# Patient Record
Sex: Male | Born: 1955
Health system: Southern US, Community
[De-identification: ages and names within clinical notes are randomized; demographics above are authoritative.]

## PROBLEM LIST (undated history)

## (undated) DIAGNOSIS — I10 Essential (primary) hypertension: Secondary | ICD-10-CM

## (undated) HISTORY — PX: BACK SURGERY: SHX140

## (undated) HISTORY — PX: HERNIA REPAIR: SHX51

---

## 1999-08-07 ENCOUNTER — Encounter: Payer: Self-pay | Admitting: *Deleted

## 1999-08-08 ENCOUNTER — Encounter: Payer: Self-pay | Admitting: *Deleted

## 1999-08-08 ENCOUNTER — Encounter: Payer: Self-pay | Admitting: Specialist

## 1999-08-08 ENCOUNTER — Inpatient Hospital Stay (HOSPITAL_COMMUNITY): Admission: EM | Admit: 1999-08-08 | Discharge: 1999-08-10 | Payer: Self-pay | Admitting: Emergency Medicine

## 1999-08-09 ENCOUNTER — Encounter: Payer: Self-pay | Admitting: Specialist

## 1999-08-10 ENCOUNTER — Encounter: Payer: Self-pay | Admitting: Specialist

## 1999-09-12 ENCOUNTER — Encounter: Admission: RE | Admit: 1999-09-12 | Discharge: 1999-09-12 | Payer: Self-pay | Admitting: Specialist

## 1999-09-12 ENCOUNTER — Encounter: Payer: Self-pay | Admitting: Specialist

## 1999-10-01 ENCOUNTER — Encounter: Payer: Self-pay | Admitting: Specialist

## 1999-10-01 ENCOUNTER — Ambulatory Visit (HOSPITAL_COMMUNITY): Admission: RE | Admit: 1999-10-01 | Discharge: 1999-10-01 | Payer: Self-pay | Admitting: Specialist

## 2014-08-26 ENCOUNTER — Encounter (HOSPITAL_COMMUNITY): Payer: Self-pay | Admitting: *Deleted

## 2014-08-26 DIAGNOSIS — T83098A Other mechanical complication of other indwelling urethral catheter, initial encounter: Secondary | ICD-10-CM | POA: Insufficient documentation

## 2014-08-26 DIAGNOSIS — Y846 Urinary catheterization as the cause of abnormal reaction of the patient, or of later complication, without mention of misadventure at the time of the procedure: Secondary | ICD-10-CM | POA: Diagnosis not present

## 2014-08-26 DIAGNOSIS — I10 Essential (primary) hypertension: Secondary | ICD-10-CM | POA: Insufficient documentation

## 2014-08-26 DIAGNOSIS — R319 Hematuria, unspecified: Secondary | ICD-10-CM | POA: Diagnosis present

## 2014-08-26 NOTE — ED Notes (Signed)
Pt was seen earlier at [facility]. Pt had decreased urine output so the placed a foley in the pt with immediate return of 12200ml's. Son states the pt had dark urine to begin with and now it is bloody. Pt has had foley since 3pm today. Pt left hospital around 4:30pm.

## 2014-08-27 ENCOUNTER — Emergency Department (HOSPITAL_COMMUNITY)
Admission: EM | Admit: 2014-08-27 | Discharge: 2014-08-27 | Disposition: A | Discharge status: Home / Self Care | Payer: 59 | Attending: Emergency Medicine | Admitting: Emergency Medicine

## 2014-08-27 ENCOUNTER — Emergency Department (HOSPITAL_COMMUNITY): Payer: 59

## 2014-08-27 DIAGNOSIS — T839XXA Unspecified complication of genitourinary prosthetic device, implant and graft, initial encounter: Secondary | ICD-10-CM

## 2014-08-27 DIAGNOSIS — R319 Hematuria, unspecified: Secondary | ICD-10-CM

## 2014-08-27 HISTORY — DX: Essential (primary) hypertension: I10

## 2014-08-27 LAB — URINALYSIS, ROUTINE W REFLEX MICROSCOPIC
Glucose, UA: NEGATIVE mg/dL
Nitrite: POSITIVE — AB
Protein, ur: >300 mg/dL — AB
Specific Gravity, Urine: 1.015 (ref 1.005–1.030)
Urobilinogen, UA: 2 mg/dL — ABNORMAL HIGH (ref 0.0–1.0)
pH: 5 (ref 5.0–8.0)

## 2014-08-27 LAB — URINE MICROSCOPIC-ADD ON

## 2014-08-27 NOTE — Discharge Instructions (Signed)

## 2014-08-27 NOTE — ED Provider Notes (Signed)
CSN: [id number]     Arrival date & time 08/26/14  2224 History  This chart was scribed for Geoffery Lyons, MD by Gwenyth Ober, ED Scribe. This patient was seen in room APA03/APA03 and the patient's care was started at 12:27 AM.    Chief Complaint  Patient presents with  . Hematuria   Patient is a 59 y.o. male presenting with hematuria. The history is provided by the patient. No language interpreter was used.  Hematuria This is a new problem. The current episode started 3 to 5 hours ago. The problem occurs constantly. The problem has been gradually worsening. Pertinent negatives include no chest pain, no abdominal pain, no headaches and no shortness of breath. Nothing aggravates the symptoms. Nothing relieves the symptoms. He has tried nothing for the symptoms. The treatment provided no relief.   HPI Comments: Adam Reeves is a 59 y.o. male, with no chronic medical conditions, who presents to the Emergency Department complaining of gradually worsening bright red hematuria, without clotting, that started earlier today. Pt was seen at [facility] ED earlier this morning for urinary retention and had a foley catheter placed. His family reports that initial urine output in his bag was dark, but is now bloody. Pt had an x-ray and lab work done at [facility]. He was prescribed a muscle relaxer and referred him to urology. Pt denies a history of prostrate issues. He also denies current pain.  Past Medical History  Diagnosis Date  . Hypertension    Past Surgical History  Procedure Laterality Date  . Back surgery    . Hernia repair     History reviewed. No pertinent family history. History  Substance Use Topics  . Smoking status: Never Smoker   . Smokeless tobacco: Not on file  . Alcohol Use: Yes     Comment: occasional    Review of Systems  Respiratory: Negative for shortness of breath.   Cardiovascular: Negative for chest pain.  Gastrointestinal: Negative for abdominal pain.  Genitourinary:  Positive for hematuria and difficulty urinating. Negative for penile pain and testicular pain.  Neurological: Negative for headaches.  All other systems reviewed and are negative.     Allergies  Review of patient's allergies indicates no known allergies.  Home Medications   Prior to Admission medications   Not on File   BP 158/90 mmHg  Pulse 88  Temp(Src) 98.5 F (36.9 C) (Oral)  Resp 18  Ht 6' (1.829 m)  Wt 235 lb (106.595 kg)  BMI 31.86 kg/m2  SpO2 98% Physical Exam  Constitutional: He is oriented to person, place, and time. He appears well-developed and well-nourished.  HENT:  Head: Normocephalic and atraumatic.  Eyes: EOM are normal.  Neck: Normal range of motion.  Cardiovascular: Normal rate, regular rhythm, normal heart sounds and intact distal pulses.   Pulmonary/Chest: Effort normal and breath sounds normal. No respiratory distress.  Abdominal: Soft. He exhibits no distension. There is no tenderness.  Genitourinary:  Foley catheter with leg bag in place. Red colored urine in the bag without clots.  Musculoskeletal: Normal range of motion.  Neurological: He is alert and oriented to person, place, and time.  Skin: Skin is warm and dry.  Psychiatric: He has a normal mood and affect. Judgment normal.  Nursing note and vitals reviewed.   ED Course  Procedures   DIAGNOSTIC STUDIES: Oxygen Saturation is 98% on RA, normal by my interpretation.    COORDINATION OF CARE: 12:38 AM Discussed treatment plan with pt which includes UA  and CT Renal Stone Study. Pt agreed to plan.   Labs Review Labs Reviewed - No data to display  Imaging Review No results found.   EKG Interpretation None      MDM   Final diagnoses:  None    Patient is a 59 year old male presents for evaluation of hematuria. He had a Foley catheter placed earlier today due to urinary retention. Shortly after arriving home urine became red tinged and he presents for evaluation of this. A renal  CT was obtained which reveals bladder wall thickening consistent with bladder outlet obstruction the setting of prostatomegaly. Urinalysis reveals grossly bloody urine.  He is to follow up with his urologist as scheduled.    I personally performed the services described in this documentation, which was scribed in my presence. The recorded information has been reviewed and is accurate.       Geoffery Lyonsouglas Keithon Mccoin, MD 08/27/14 [id number]

## 2015-12-25 ENCOUNTER — Emergency Department (HOSPITAL_COMMUNITY): Payer: Worker's Compensation

## 2015-12-25 ENCOUNTER — Emergency Department (HOSPITAL_COMMUNITY)
Admission: EM | Admit: 2015-12-25 | Discharge: 2015-12-25 | Disposition: A | Discharge status: Home / Self Care | Payer: Worker's Compensation | Attending: Emergency Medicine | Admitting: Emergency Medicine

## 2015-12-25 ENCOUNTER — Encounter (HOSPITAL_COMMUNITY): Payer: Self-pay | Admitting: Emergency Medicine

## 2015-12-25 DIAGNOSIS — N453 Epididymo-orchitis: Secondary | ICD-10-CM | POA: Diagnosis not present

## 2015-12-25 DIAGNOSIS — R112 Nausea with vomiting, unspecified: Secondary | ICD-10-CM | POA: Insufficient documentation

## 2015-12-25 DIAGNOSIS — I10 Essential (primary) hypertension: Secondary | ICD-10-CM | POA: Insufficient documentation

## 2015-12-25 DIAGNOSIS — Z79899 Other long term (current) drug therapy: Secondary | ICD-10-CM | POA: Diagnosis not present

## 2015-12-25 DIAGNOSIS — N400 Enlarged prostate without lower urinary tract symptoms: Secondary | ICD-10-CM | POA: Diagnosis not present

## 2015-12-25 DIAGNOSIS — N50812 Left testicular pain: Secondary | ICD-10-CM

## 2015-12-25 DIAGNOSIS — N50819 Testicular pain, unspecified: Secondary | ICD-10-CM | POA: Diagnosis present

## 2015-12-25 LAB — I-STAT CHEM 8, ED
BUN: 11 mg/dL (ref 6–20)
CALCIUM ION: 1.17 mmol/L (ref 1.12–1.23)
CHLORIDE: 104 mmol/L (ref 101–111)
CREATININE: 0.9 mg/dL (ref 0.61–1.24)
Glucose, Bld: 126 mg/dL — ABNORMAL HIGH (ref 65–99)
HEMATOCRIT: 41 % (ref 39.0–52.0)
Hemoglobin: 13.9 g/dL (ref 13.0–17.0)
POTASSIUM: 3.9 mmol/L (ref 3.5–5.1)
SODIUM: 139 mmol/L (ref 135–145)
TCO2: 22 mmol/L (ref 0–100)

## 2015-12-25 LAB — CBC WITH DIFFERENTIAL/PLATELET
BASOS ABS: 0 10*3/uL (ref 0.0–0.1)
BASOS PCT: 0 %
EOS ABS: 0 10*3/uL (ref 0.0–0.7)
Eosinophils Relative: 0 %
HEMATOCRIT: 39.9 % (ref 39.0–52.0)
HEMOGLOBIN: 13.8 g/dL (ref 13.0–17.0)
Lymphocytes Relative: 4 %
Lymphs Abs: 0.6 10*3/uL — ABNORMAL LOW (ref 0.7–4.0)
MCH: 29.6 pg (ref 26.0–34.0)
MCHC: 34.6 g/dL (ref 30.0–36.0)
MCV: 85.6 fL (ref 78.0–100.0)
MONOS PCT: 10 %
Monocytes Absolute: 1.4 10*3/uL — ABNORMAL HIGH (ref 0.1–1.0)
NEUTROS ABS: 12.5 10*3/uL — AB (ref 1.7–7.7)
NEUTROS PCT: 86 %
Platelets: 182 10*3/uL (ref 150–400)
RBC: 4.66 MIL/uL (ref 4.22–5.81)
RDW: 13 % (ref 11.5–15.5)
WBC: 14.5 10*3/uL — AB (ref 4.0–10.5)

## 2015-12-25 LAB — I-STAT CG4 LACTIC ACID, ED: Lactic Acid, Venous: 0.97 mmol/L (ref 0.5–1.9)

## 2015-12-25 MED ORDER — ONDANSETRON 4 MG PO TBDP: 4.0000 mg | ORAL_TABLET | Freq: Three times a day (TID) | ORAL | Status: DC | PRN

## 2015-12-25 MED ORDER — SODIUM CHLORIDE 0.9 % IV BOLUS (SEPSIS)
1000.0000 mL | Freq: Once | INTRAVENOUS | Status: AC
  Administered 2015-12-25: 1000 mL via INTRAVENOUS

## 2015-12-25 MED ORDER — IBUPROFEN 400 MG PO TABS
600.0000 mg | ORAL_TABLET | Freq: Once | ORAL | Status: AC
  Administered 2015-12-25: 600 mg via ORAL
  Filled 2015-12-25: qty 1

## 2015-12-25 MED ORDER — IBUPROFEN 600 MG PO TABS: 600.0000 mg | ORAL_TABLET | Freq: Four times a day (QID) | ORAL | Status: DC | PRN

## 2015-12-25 MED ORDER — HYDROCODONE-ACETAMINOPHEN 5-325 MG PO TABS
1.0000 | ORAL_TABLET | Freq: Once | ORAL | Status: AC
Start: 2015-12-25 — End: 2015-12-25
  Administered 2015-12-25: 1 via ORAL
  Filled 2015-12-25: qty 1

## 2015-12-25 MED ORDER — LEVOFLOXACIN 500 MG PO TABS: 500.0000 mg | ORAL_TABLET | Freq: Two times a day (BID) | ORAL | Status: DC

## 2015-12-25 MED ORDER — HYDROCODONE-ACETAMINOPHEN 5-325 MG PO TABS: 1.0000 | ORAL_TABLET | Freq: Four times a day (QID) | ORAL | Status: DC | PRN

## 2015-12-25 MED ORDER — IOPAMIDOL (ISOVUE-300) INJECTION 61%
INTRAVENOUS | Status: AC
  Filled 2015-12-25: qty 100

## 2015-12-25 MED ORDER — HYDROMORPHONE HCL 1 MG/ML IJ SOLN
1.0000 mg | Freq: Once | INTRAMUSCULAR | Status: AC
  Administered 2015-12-25: 1 mg via INTRAVENOUS
  Filled 2015-12-25: qty 1

## 2015-12-25 MED ORDER — ONDANSETRON HCL 4 MG/2ML IJ SOLN
4.0000 mg | Freq: Once | INTRAMUSCULAR | Status: AC
  Administered 2015-12-25: 4 mg via INTRAVENOUS
  Filled 2015-12-25: qty 2

## 2015-12-25 NOTE — ED Notes (Signed)
Onset 3 days ago lifting heavy furniture pain testicles increasing in size today. Seen at [facility]Doctor's office today sent to ED for evaluation via EMS.  Nausea and emesis en route with testicle pain. EMS administered Zofran 4mg  and Morphine 10mg  with 0.9 NS 100ML.

## 2015-12-25 NOTE — Discharge Instructions (Signed)
See the Urologist in 1 week. Take the meds as requested. Scrotal elevation, rest from athletic activity, warm baths, and NSAIDs are main treatment options.   Orchitis Orchitis is swelling (inflammation) of a testicle caused by infection. Testicles are the male organs that produce sperm. The testicles are held in a fleshy sac (scrotum) located behind the penis. Orchitis usually affects only one testicle, but it can occur in both. The condition can develop suddenly. Orchitis can be caused by many different kinds of bacteria and viruses. CAUSES Orchitis can be caused by either a bacterial or viral infection. Bacterial Infections  These often occur along with an infection of the coiled tube that collects sperm and sits on top of the testicle (epididymis).  In men who are not sexually active, bacterial orchitis usually starts as a urinary tract infection and spreads to the testicle.  In sexually active men, sexually transmitted infections are the most common cause of bacterial orchitis. These can include:  Gonorrhea.  Chlamydia. Viral Infections  Mumps is still the most common cause of viral orchitis, though mumps is now rare in many areas because of vaccination.  Other viruses that can cause orchitis include:  The chickenpox virus (varicella-zoster virus).  The virus that causes mononucleosis (Epstein-Barr virus). RISK FACTORS Boys and men who have not been vaccinated against mumps are at risk for mumps orchitis. Risk factors for bacterial orchitis include:  Frequent urinary tract infections.  High-risk sexual behaviors.  Having a sexual partner with a sexually transmitted infection.  Having had urinary tract surgery.  Using a tube passed through the penis to drain urine (Foley catheter).  An enlarged prostate gland. SIGNS AND SYMPTOMS The most common symptoms of orchitis are swelling and pain in the scrotum. Other signs and symptoms may include:  Feeling generally sick  (malaise).  Fever and chills.  Painful urination.  Painful ejaculation.  Blood or discharge from the penis.  Nausea.  Headache.  Fatigue. DIAGNOSIS Your health care provider may suspect orchitis if you have a painful, swollen testicle along with other signs and symptoms of the condition. A physical exam will be done. Tests may also be done to help your health care provider make a diagnosis. These may include:  A blood test to check for signs of infection.  A urine test to check for a urinary tract infection.  Using a swab to collect a fluid sample from the tip of the penis to test for sexually transmitted infections.  Taking an image of the testicle using sound waves and a computer (testicular ultrasound). TREATMENT Treatment of orchitis depends on the cause. For orchitis caused by a bacterial infection, your health care provider will most likely prescribe antibiotic medicines. Bacterial infections usually clear up within a few days. Both viral infections and bacterial infections may be treated with:  Bed rest.  Anti-inflammatory medicines.  Pain medicines.  Elevating the scrotum and applying ice. HOME CARE INSTRUCTIONS  Rest as directed by your health care provider.  Take medicines only as directed by your health care provider.  If you were prescribed an antibiotic medicine, finish it all even if you start to feel better.  Elevate your scrotum and apply ice as directed:  Put ice in a plastic bag.  Place a small towel or pillow between your legs.  Rest your scrotum on the pillow or towel.  Place another towel between your skin and the plastic bag.  Leave the ice on for 20 minutes, 2-3 times a day. SEEK MEDICAL CARE  IF:  You have a fever.  Pain and swelling have not gotten better after 3 days. SEEK IMMEDIATE MEDICAL CARE IF:  Your pain is getting worse.  The swelling in your testicle gets worse.   This information is not intended to replace advice  given to you by your health care provider. Make sure you discuss any questions you have with your health care provider.   Document Released: 05/24/2000 Document Revised: 06/17/2014 Document Reviewed: 10/14/2013 Elsevier Interactive Patient Education 2016 [employer] Epididymitis Epididymitis is swelling (inflammation) of the epididymis. The epididymis is a cord-like structure that is located along the top and back part of the testicle. It collects and stores sperm from the testicle. This condition can also cause pain and swelling of the testicle and scrotum. Symptoms usually start suddenly (acute epididymitis). Sometimes epididymitis starts gradually and lasts for a while (chronic epididymitis). This type may be harder to treat. CAUSES In men 1535 and younger, this condition is usually caused by a bacterial infection or sexually transmitted disease (STD), such as:  Gonorrhea.  Chlamydia.  In men 2435 and older who do not have anal sex, this condition is usually caused by bacteria from a blockage or abnormalities in the urinary system. These can result from:  Having a tube placed into the bladder (urinary catheter).  Having an enlarged or inflamed prostate gland.  Having recent urinary tract surgery. In men who have a condition that weakens the body's defense system (immune system), such as HIV, this condition can be caused by:   Other bacteria, including tuberculosis and syphilis.  Viruses.  Fungi. Sometimes this condition occurs without infection. That may happen if urine flows backward into the epididymis after heavy lifting or straining. RISK FACTORS This condition is more likely to develop in men:  Who have unprotected sex with more than one partner.  Who have anal sex.   Who have recently had surgery.   Who have a urinary catheter.  Who have urinary problems.  Who have a suppressed immune system. SYMPTOMS  This condition usually begins suddenly with chills, fever,  and pain behind the scrotum and in the testicle. Other symptoms include:   Swelling of the scrotum, testicle, or both.  Pain whenejaculatingor urinating.  Pain in the back or belly.  Nausea.  Itching and discharge from the penis.  Frequent need to pass urine.  Redness and tenderness of the scrotum. DIAGNOSIS Your health care provider can diagnose this condition based on your symptoms and medical history. Your health care provider will also do a physical exam to ask about your symptoms and check your scrotum and testicle for swelling, pain, and redness. You may also have other tests, including:   Examination of discharge from the penis.  Urine tests for infections, such as STDs.  Your health care provider may test you for other STDs, including HIV. TREATMENT Treatment for this condition depends on the cause. If your condition is caused by a bacterial infection, oral antibiotic medicine may be prescribed. If the bacterial infection has spread to your blood, you may need to receive IV antibiotics. Nonbacterial epididymitis is treated with home care that includes bed rest and elevation of the scrotum. Surgery may be needed to treat:  Bacterial epididymitis that causes pus to build up in the scrotum (abscess).  Chronic epididymitis that has not responded to other treatments. HOME CARE INSTRUCTIONS Medicines  Take over-the-counter and prescription medicines only as told by your health care provider.   If you were prescribed an  antibiotic medicine, take it as told by your health care provider. Do not stop taking the antibiotic even if your condition improves. Sexual Activity  If your epididymitis was caused by an STD, avoid sexual activity until your treatment is complete.  Inform your sexual partner or partners if you test positive for an STD. They may need to be treated.Do not engage in sexual activity with your partner or partners until their treatment is  completed. General Instructions  Return to your normal activities as told by your health care provider. Ask your health care provider what activities are safe for you.  Keep your scrotum elevated and supported while resting. Ask your health care provider if you should wear a scrotal support, such as a jockstrap. Wear it as told by your health care provider.  If directed, apply ice to the affected area:   Put ice in a plastic bag.  Place a towel between your skin and the bag.  Leave the ice on for 20 minutes, 2-3 times per day.  Try taking a sitz bath to help with discomfort. This is a warm water bath that is taken while you are sitting down. The water should only come up to your hips and should cover your buttocks. Do this 3-4 times per day or as told by your health care provider.  Keep all follow-up visits as told by your health care provider. This is important. SEEK MEDICAL CARE IF:   You have a fever.   Your pain medicine is not helping.   Your pain is getting worse.   Your symptoms do not improve within three days.   This information is not intended to replace advice given to you by your health care provider. Make sure you discuss any questions you have with your health care provider.   Document Released: 05/24/2000 Document Revised: 02/15/2015 Document Reviewed: 10/12/2014 Elsevier Interactive Patient Education [employer]

## 2015-12-25 NOTE — ED Notes (Signed)
Patient transported to Ultrasound 

## 2015-12-25 NOTE — ED Provider Notes (Addendum)
CSN: [id number]     Arrival date & time 12/25/15  1357 History   First MD Initiated Contact with Patient 12/25/15 1412     Chief Complaint  Patient presents with  . Testicle Pain  . Nausea  . Emesis     (Consider location/radiation/quality/duration/timing/severity/associated sxs/prior Treatment) HPI Comments: PT comes in with scrotal pain. Pt reports that his pain started on Friday, after he moved some furniture. Pain has intensified since then and so has the swelling. Pain is throbbing and severe, with associated nausea and emesis. Pt has no trauma that he can think of and he didn't feel a pop or something dropping into his scrotum when he was working. No penile discharge or risk for STD.   ROS 10 Systems reviewed and are negative for acute change except as noted in the HPI.     Patient is a 60 y.o. male presenting with testicular pain and vomiting. The history is provided by the patient.  Testicle Pain  Emesis   Past Medical History  Diagnosis Date  . Hypertension    Past Surgical History  Procedure Laterality Date  . Back surgery    . Hernia repair     No family history on file. Social History  Substance Use Topics  . Smoking status: Never Smoker   . Smokeless tobacco: None  . Alcohol Use: Yes     Comment: occasional    Review of Systems  Gastrointestinal: Positive for vomiting.  Genitourinary: Positive for testicular pain.      Allergies  Review of patient's allergies indicates no known allergies.  Home Medications   Prior to Admission medications   Medication Sig Start Date End Date Taking? Authorizing Provider  HYDROcodone-acetaminophen (NORCO/VICODIN) 5-325 MG tablet Take 1 tablet by mouth every 6 (six) hours as needed for severe pain. 12/25/15   Derwood KaplanAnkit Damyon Mullane, MD  ibuprofen (ADVIL,MOTRIN) 600 MG tablet Take 1 tablet (600 mg total) by mouth every 6 (six) hours as needed. 12/25/15   Derwood KaplanAnkit Octavion Mollenkopf, MD  levofloxacin (LEVAQUIN) 500 MG tablet Take 1  tablet (500 mg total) by mouth 2 (two) times daily. 12/25/15   Derwood KaplanAnkit Dylen Mcelhannon, MD  ondansetron (ZOFRAN ODT) 4 MG disintegrating tablet Take 1 tablet (4 mg total) by mouth every 8 (eight) hours as needed for nausea or vomiting. 12/25/15   Derwood KaplanAnkit Braxden Lovering, MD   BP 141/77 mmHg  Pulse 97  Temp(Src) 99.6 F (37.6 C) (Oral)  Resp 17  Ht 6\' 8"  (2.032 m)  Wt 240 lb (108.863 kg)  BMI 26.37 kg/m2  SpO2 95% Physical Exam  Constitutional: He is oriented to person, place, and time. He appears well-developed.  HENT:  Head: Normocephalic and atraumatic.  Eyes: Conjunctivae and EOM are normal. Pupils are equal, round, and reactive to light.  Neck: Normal range of motion. Neck supple.  Cardiovascular: Normal rate, regular rhythm and normal heart sounds.   Pulmonary/Chest: Effort normal and breath sounds normal. No respiratory distress. He has no wheezes.  Abdominal: Soft. Bowel sounds are normal. He exhibits no distension. There is no tenderness. There is no rebound and no guarding.  Genitourinary:  Bilateral scrotal enlargement, L worse than R. Pt has induration in the L scrotum, no light illumination. Pt has no cremasteric reflex. Pt has severe tenderness of the L hemiscrotum and the scrotum is indurated.  Neurological: He is alert and oriented to person, place, and time.  Skin: Skin is warm.  Nursing note and vitals reviewed.   ED Course  Procedures (including critical  care time) Labs Review Labs Reviewed  CBC WITH DIFFERENTIAL/PLATELET - Abnormal; Notable for the following:    WBC 14.5 (*)    Neutro Abs 12.5 (*)    Lymphs Abs 0.6 (*)    Monocytes Absolute 1.4 (*)    All other components within normal limits  I-STAT CHEM 8, ED - Abnormal; Notable for the following:    Glucose, Bld 126 (*)    All other components within normal limits  I-STAT CG4 LACTIC ACID, ED    Imaging Review US Scrotum  12/25/2015  CLINICAL DATA:  60 year old male with left-sided testicular redness and swelling since  12/22/2015 after lifting heavy furniture. EXAM: SCROTAL ULTRASOUND DOPPLER ULTRASOUND OF THE TESTICLES TECHNIQUE: Complete ultrasound examination of the testicles, epididymis, and other scrotal structures was performed. Color and spectral Doppler ultrasound were also utilized to evaluate blood flow to the testicles. COMPARISON:  No priors. FINDINGS: Right testicle Measurements: 5.1 x 2.5 x 3.5 cm. No mass or microlithiasis visualized. Left testicle Measurements: 5.1 x 4.1 x 4.1 cm. No mass or microlithiasis visualized. Right epididymis:  Normal in size and appearance. Left epididymis: Mildly enlarged measuring 2.8 x 1.6 x 2.5 cm, and diffusely increased vascularity. Hydrocele: Small right hydrocele. Moderate left-sided hydrocele which is highly septated. Varicocele:  Bilateral (veins measure up to 3 mm in diameter). Pulsed Doppler interrogation of both testes demonstrates normal low resistance arterial and venous waveforms in the right testicle and right epididymis. Increased vascularity noted in the left testicle and left epididymis. Other:  Extensive scrotal skin thickening. IMPRESSION: 1. Findings are compatible with a left-sided epididymo-orchitis with moderate complex left-sided hydrocele. 2. Small right-sided hydrocele. 3. Bilateral varicocele. Electronically Signed   By: Trudie Reed M.D.   On: 12/25/2015 15:53   Ct Abdomen Pelvis W Contrast  12/25/2015  CLINICAL DATA:  Possible incarcerated hernia, left orchitis, left side pain after lifting heavy furniture 3 days ago EXAM: CT ABDOMEN AND PELVIS WITH CONTRAST TECHNIQUE: Multidetector CT imaging of the abdomen and pelvis was performed using the standard protocol following bolus administration of intravenous contrast. CONTRAST:  100 cc Isovue COMPARISON:  Ultrasound 12/25/2015 and CT scan 08/27/2014 FINDINGS: Lower chest:  The lung bases are unremarkable.  Small hiatal hernia. Hepatobiliary: Enhanced liver is unremarkable. No calcified gallstones are  noted within gallbladder. Pancreas: Enhanced pancreas is unremarkable. Spleen: No focal splenic mass. Small capsular calcification mid aspect of the spleen is stable. Adrenals/Urinary Tract: No adrenal gland mass. No nephrolithiasis. No hydronephrosis or hydroureter. Mild dilatation of left extrarenal pelvis. Delayed renal images shows bilateral renal symmetrical excretion. Bilateral visualized proximal ureter is unremarkable. There is mild thickening of anterior wall of the urinary bladder. Mild cystitis cannot be excluded. Stomach/Bowel: No gastric outlet obstruction. Mild gaseous distended small bowel loops in mid abdomen and lower abdomen. Mild ileus or enteritis cannot be excluded. No definite evidence of small bowel obstruction. Moderate gas and some stool noted within cecum. Normal appendix. No pericecal inflammation. Colonic diverticula are noted descending colon and proximal sigmoid colon. No evidence of acute diverticulitis. Vascular/Lymphatic: No aortic aneurysm. No retroperitoneal or mesenteric adenopathy. Reproductive: There is significant enlarged prostate gland with indentation of urinary bladder base. Prostate gland measures 7.2 by 6.1 cm. There is also some heterogeneous enhancement of left seminal vesicle. Inflammation cannot be excluded. Clinical correlation is necessary Other: Bilateral small varicocele is noted the left greater than right. There is enhancement of the left testicle. Findings highly suspicious for left orchitis as seen on recent scrotal ultrasound. No  inguinal hernia. Musculoskeletal: No destructive bony lesions are noted. Sagittal images of the spine shows degenerative changes lumbar spine. There is Schmorl's node deformity upper endplate of L3. Disc space flattening with mild anterior spurring at L4-L5 and L5-S1 level. Facet degenerative changes L4 and L5 level. IMPRESSION: 1. Mild gaseous distended small bowel loops in mid abdomen and lower abdomen with some air-fluid level  suspicious for ileus or enteritis. No definite evidence of small bowel obstruction. 2. There is enlarged prostate gland with indentation of urinary bladder base. Correlation with urology exam is recommended. Mild thickening of anterior wall of the urinary bladder. Mild cystitis cannot be excluded. There is heterogeneous enhancement of left seminal vesicle. Inflammatory changes cannot be excluded. Clinical correlation is necessary. 3. No inguinal hernia. 4. Bilateral varicocele is noted left greater than right. There is diffuse enhancement of the left testicle highly suspicious for inflammation or infection. 5. Degenerative changes lumbar spine. 6. No hydronephrosis or hydroureter. 7. No pericecal inflammation.  Normal appendix. Electronically Signed   By: Natasha Mead M.D.   On: 12/25/2015 16:12   [place] Art/ven Flow Abd Pelv Doppler  12/25/2015  CLINICAL DATA:  60 year old male with left-sided testicular redness and swelling since 12/22/2015 after lifting heavy furniture. EXAM: SCROTAL ULTRASOUND DOPPLER ULTRASOUND OF THE TESTICLES TECHNIQUE: Complete ultrasound examination of the testicles, epididymis, and other scrotal structures was performed. Color and spectral Doppler ultrasound were also utilized to evaluate blood flow to the testicles. COMPARISON:  No priors. FINDINGS: Right testicle Measurements: 5.1 x 2.5 x 3.5 cm. No mass or microlithiasis visualized. Left testicle Measurements: 5.1 x 4.1 x 4.1 cm. No mass or microlithiasis visualized. Right epididymis:  Normal in size and appearance. Left epididymis: Mildly enlarged measuring 2.8 x 1.6 x 2.5 cm, and diffusely increased vascularity. Hydrocele: Small right hydrocele. Moderate left-sided hydrocele which is highly septated. Varicocele:  Bilateral (veins measure up to 3 mm in diameter). Pulsed Doppler interrogation of both testes demonstrates normal low resistance arterial and venous waveforms in the right testicle and right epididymis. Increased vascularity  noted in the left testicle and left epididymis. Other:  Extensive scrotal skin thickening. IMPRESSION: 1. Findings are compatible with a left-sided epididymo-orchitis with moderate complex left-sided hydrocele. 2. Small right-sided hydrocele. 3. Bilateral varicocele. Electronically Signed   By: Trudie Reed M.D.   On: 12/25/2015 15:53   I have personally reviewed and evaluated these images and lab results as part of my medical decision-making.   EKG Interpretation None      MDM   Final diagnoses:  Pain in left testicle  Epididymo-orchitis without abscess    Pt has 3 days of worsening pain in his L hemiscrotum. Clinical concerns for incarcerated inguinal hernia vs. Inguinal hernia vs. Torsed testicle vs. Testicular trauma/traumatic epididymitis. US scrotum STAT ordered. CT abd pelvis ordered.  Pain meds started.  Derwood Kaplan, MD 12/25/15 1506  Derwood Kaplan, MD 12/25/15 [phone]

## 2015-12-26 ENCOUNTER — Telehealth: Payer: Self-pay | Admitting: *Deleted

## 2015-12-26 MED ORDER — IOPAMIDOL (ISOVUE-300) INJECTION 61%
100.0000 mL | Freq: Once | INTRAVENOUS | Status: AC | PRN
  Administered 2015-12-25: 100 mL via INTRAVENOUS

## 2017-09-01 ENCOUNTER — Encounter: Payer: Self-pay | Admitting: Pediatrics

## 2017-09-01 ENCOUNTER — Ambulatory Visit: Payer: PRIVATE HEALTH INSURANCE | Admitting: Pediatrics

## 2017-09-01 VITALS — BP 156/91 | HR 72 | Temp 96.9°F | Ht >= 80 in | Wt 241.0 lb

## 2017-09-01 DIAGNOSIS — I10 Essential (primary) hypertension: Secondary | ICD-10-CM

## 2017-09-01 DIAGNOSIS — Z0000 Encounter for general adult medical examination without abnormal findings: Secondary | ICD-10-CM

## 2017-09-01 DIAGNOSIS — Z1211 Encounter for screening for malignant neoplasm of colon: Secondary | ICD-10-CM

## 2017-09-01 MED ORDER — LISINOPRIL 20 MG PO TABS: 20.0000 mg | ORAL_TABLET | Freq: Every day | ORAL | 3 refills | Status: DC

## 2017-09-01 NOTE — Progress Notes (Signed)
Subjective:   Patient ID: Adam Reeves, male    DOB: [birth year], 62 y.o.   MRN: [id number] CC: New Patient (Initial Visit) Elevated blood pressure   HPI: Adam Reeves is a 63 y.o. male presenting for New Patient (Initial Visit)  Blood pressure recently checked for work.  160s over 90s.  He says it is similar at home.  The lowest systolic blood pressure he can remember is in the 140s.  He says a couple years ago he was on lisinopril 20 mg.  This was stopped because he was told he no longer needed it.  He knows when his blood pressure gets high because he hears more ringing in his ears.  Works as a Programmer, systems.  Is out of town for about a week at a time.  No family history of colon cancer, prostate cancer.  Grew up in foster care, knew his mom and dad.  Blood transfusion when he is baby for "mismatched blood".  Regular exercises, walks regularly and enjoys riding his bicycle.  In the morning sometimes has some dribbling with his urine stream.  He gets better throughout the day.  He has been on both finasteride and Flomax together in the past.  Episode of urinary retention when he had a testicular injury about 2 years ago.  He is not needed to self cath in over a year.  He does not like the way that the Flomax and finasteride made him feel so he stopped them over a year ago..  No worsening in symptoms and then.  No new sexual partners since his divorce 12 years ago.  He declines STI testing today.  Past Medical History:  Diagnosis Date  . Hypertension    History reviewed. No pertinent family history. Social History   Socioeconomic History  . Marital status: Divorced    Spouse name: Not on file  . Number of children: 4  . Years of education: Not on file  . Highest education level: Not on file  Occupational History  . Not on file  Social Needs  . Financial resource strain: Not hard at all  . Food insecurity:    Worry: Never true    Inability: Never true  .  Transportation needs:    Medical: No    Non-medical: No  Tobacco Use  . Smoking status: Never Smoker  . Smokeless tobacco: Never Used  Substance and Sexual Activity  . Alcohol use: Yes    Comment: occasional  . Drug use: No  . Sexual activity: Yes  Lifestyle  . Physical activity:    Days per week: 0 days    Minutes per session: 0 min  . Stress: Not at all  Relationships  . Social connections:    Talks on phone: Once a week    Gets together: Once a week    Attends religious service: 1 to 4 times per year    Active member of club or organization: No    Attends meetings of clubs or organizations: Never    Relationship status: Divorced  Other Topics Concern  . Not on file  Social History Narrative  . Not on file   ROS: All systems negative other than what is in HPI  Objective:    BP (!) 156/91   Pulse 72   Temp (!) 96.9 F (36.1 C) (Oral)   Ht '6\' 8"'$  (2.032 m)   Wt 241 lb (109.3 kg)   BMI 26.48 kg/m   Wt Readings from  Last 3 Encounters:  09/01/17 241 lb (109.3 kg)  12/25/15 240 lb (108.9 kg)  08/26/14 235 lb (106.6 kg)    Gen: NAD, alert, cooperative with exam, NCAT EYES: EOMI, no conjunctival injection, or no icterus ENT: Hearing aids in place.  TMs dull gray b/l, OP without erythema LYMPH: no cervical LAD CV: NRRR, normal S1/S2, no murmur, distal pulses 2+ b/l Resp: CTABL, no wheezes, normal WOB Abd: +BS, soft, NTND. no guarding or organomegaly Ext: No edema, warm Neuro: Alert and oriented, strength equal b/l UE and LE, coordination grossly normal MSK: normal muscle bulk  Assessment & Plan:  Hoyle was seen today for new patient (initial visit) and annual.  Diagnoses and all orders for this visit:  Encounter for preventive health examination -     Lipid panel -     CMP14+EGFR -     Hepatitis C antibody -     PSA, total and free  Essential hypertension Elevated blood pressure today.  Start below.  Will check labs.  Check blood pressures daily at home.   Bring numbers to next office visit. -     lisinopril (PRINIVIL,ZESTRIL) 20 MG tablet; Take 1 tablet (20 mg total) by mouth daily.  Colon cancer screening Declines colonoscopy.  Open to doing below. -     Fecal occult blood, imunochemical; Future   Follow up plan: Return in about 2 weeks (around 09/15/2017).  Assunta Found, MD [place]

## 2017-09-01 NOTE — Patient Instructions (Signed)
Check blood pressures at home.  Bring list of numbers back at next visit in 2 weeks.

## 2017-09-02 LAB — HEPATITIS C ANTIBODY: Hep C Virus Ab: <0.1 s/co ratio (ref 0.0–0.9)

## 2017-09-02 LAB — LIPID PANEL
CHOL/HDL RATIO: 4.8 ratio (ref 0.0–5.0)
Cholesterol, Total: 176 mg/dL (ref 100–199)
HDL: 37 mg/dL — ABNORMAL LOW (ref >39)
LDL CALC: 116 mg/dL — AB (ref 0–99)
TRIGLYCERIDES: 114 mg/dL (ref 0–149)
VLDL Cholesterol Cal: 23 mg/dL (ref 5–40)

## 2017-09-02 LAB — CMP14+EGFR
A/G RATIO: 1.8 (ref 1.2–2.2)
ALT: 28 IU/L (ref 0–44)
AST: 23 IU/L (ref 0–40)
Albumin: 4.6 g/dL (ref 3.6–4.8)
Alkaline Phosphatase: 87 IU/L (ref 39–117)
BUN/Creatinine Ratio: 17 (ref 10–24)
BUN: 16 mg/dL (ref 8–27)
Bilirubin Total: 0.3 mg/dL (ref 0.0–1.2)
CALCIUM: 9.6 mg/dL (ref 8.6–10.2)
CHLORIDE: 106 mmol/L (ref 96–106)
CO2: 17 mmol/L — ABNORMAL LOW (ref 20–29)
Creatinine, Ser: 0.92 mg/dL (ref 0.76–1.27)
GFR calc Af Amer: 103 mL/min/{1.73_m2} (ref >59)
GFR, EST NON AFRICAN AMERICAN: 89 mL/min/{1.73_m2} (ref >59)
Globulin, Total: 2.6 g/dL (ref 1.5–4.5)
Glucose: 100 mg/dL — ABNORMAL HIGH (ref 65–99)
POTASSIUM: 4.3 mmol/L (ref 3.5–5.2)
Sodium: 142 mmol/L (ref 134–144)
Total Protein: 7.2 g/dL (ref 6.0–8.5)

## 2017-09-02 LAB — PSA, TOTAL AND FREE
PSA FREE: 1.1 ng/mL
PSA, Free Pct: 12.4 %
Prostate Specific Ag, Serum: 8.9 ng/mL — ABNORMAL HIGH (ref 0.0–4.0)

## 2017-09-04 ENCOUNTER — Telehealth: Payer: Self-pay | Admitting: *Deleted

## 2017-09-04 ENCOUNTER — Other Ambulatory Visit: Payer: Self-pay | Admitting: Pediatrics

## 2017-09-04 DIAGNOSIS — R972 Elevated prostate specific antigen [PSA]: Secondary | ICD-10-CM

## 2017-09-04 NOTE — Telephone Encounter (Signed)
Patient called back with an explanation of labs.  Explained results to patient and patient states that he does not want to see a urologist at this time.

## 2017-09-05 NOTE — Telephone Encounter (Signed)
Tried to call pt back, not able to to reach him. Is there a good time to reach him if he calls back?

## 2017-09-15 ENCOUNTER — Encounter: Payer: Self-pay | Admitting: Pediatrics

## 2017-09-15 ENCOUNTER — Ambulatory Visit: Payer: PRIVATE HEALTH INSURANCE | Admitting: Pediatrics

## 2017-09-15 VITALS — BP 137/84 | HR 79 | Temp 97.1°F | Ht >= 80 in | Wt 238.2 lb

## 2017-09-15 DIAGNOSIS — G4700 Insomnia, unspecified: Secondary | ICD-10-CM | POA: Diagnosis not present

## 2017-09-15 DIAGNOSIS — I10 Essential (primary) hypertension: Secondary | ICD-10-CM | POA: Diagnosis not present

## 2017-09-15 DIAGNOSIS — R972 Elevated prostate specific antigen [PSA]: Secondary | ICD-10-CM

## 2017-09-15 DIAGNOSIS — Z1211 Encounter for screening for malignant neoplasm of colon: Secondary | ICD-10-CM

## 2017-09-15 MED ORDER — TRAZODONE HCL 50 MG PO TABS: 25.0000 mg | ORAL_TABLET | Freq: Every evening | ORAL | 3 refills | Status: DC | PRN

## 2017-09-15 MED ORDER — LISINOPRIL 40 MG PO TABS: 40.0000 mg | ORAL_TABLET | Freq: Every day | ORAL | 1 refills | Status: DC

## 2017-09-15 NOTE — Addendum Note (Signed)
Addended by: Orma RenderHODGES, Titiana Severa F on: 09/15/2017 03:36 PM   Modules accepted: Orders

## 2017-09-15 NOTE — Progress Notes (Signed)
  Subjective:   Patient ID: Adam Reeves, male    DOB: 05/06/1956, 62 y.o.   MRN: [id number] CC: Follow-up (2 week, BP)  HPI: Adam Reeves is a 62 y.o. male presenting for Follow-up (2 week, BP)  Hypertension: Blood pressures at home systolics 441N-127K, diastolics upper 71U-36D.  No headaches or lightheadedness.  Elevated PSA: Follows with urology.  Says he will not be able to get back into see them until later this year after he finishes paying her bills.  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  Wants to be able to sleep overnight.  Has tried melatonin and Benadryl.  He feels groggy the next day and also is still usually up within 2 hours of taking it.  Relevant past medical, surgical, family and social history reviewed. Allergies and medications reviewed and updated. Social History   Tobacco Use  Smoking Status Never Smoker  Smokeless Tobacco Never Used   ROS: Per HPI   Objective:    BP 137/84   Pulse 79   Temp (!) 97.1 F (36.2 C) (Oral)   Ht '6\' 8"'$  (2.032 m)   Wt 238 lb 3.2 oz (108 kg)   BMI 26.17 kg/m   Wt Readings from Last 3 Encounters:  09/15/17 238 lb 3.2 oz (108 kg)  09/01/17 241 lb (109.3 kg)  12/25/15 240 lb (108.9 kg)    Gen: NAD, alert, cooperative with exam, NCAT EYES: EOMI, no conjunctival injection, or no icterus CV: NRRR, normal S1/S2, no murmur, distal pulses 2+ b/l Resp: CTABL, no wheezes, normal WOB Ext: No edema, warm Neuro: Alert and oriented, strength equal b/l UE and LE, coordination grossly normal MSK: normal muscle bulk  Assessment & Plan:  Maris was seen today for follow-up multiple medical problems.  Diagnoses and all orders for this visit:  Insomnia, unspecified type Will do trial below.  Discussed sleep hygiene. -     traZODone (DESYREL) 50 MG tablet; Take 0.5-1 tablets (25-50 mg total) by mouth at bedtime as needed for sleep.  Essential hypertension Remains elevated.  Will  increase to 40 mg. -     BMP8+EGFR -     lisinopril (PRINIVIL,ZESTRIL) 40 MG tablet; Take 1 tablet (40 mg total) by mouth daily.  Elevated PSA Has a urologist.  Patient hoping to be able to get into see him after being his bill with him later this year.  Follow up plan: Return in about 6 months (around 03/17/2018). Assunta Found, MD [place]

## 2017-09-15 NOTE — Patient Instructions (Addendum)
Come back for nurse visit in 4 weeks, bring your blood pressure cuff.  Let me know if blood pressure is regularly greater than 140 on the top number, greater than 90 on the bottom number.  Take 40 mg of lisinopril daily.

## 2017-09-16 LAB — BMP8+EGFR
BUN / CREAT RATIO: 15 (ref 10–24)
BUN: 14 mg/dL (ref 8–27)
CO2: 22 mmol/L (ref 20–29)
CREATININE: 0.91 mg/dL (ref 0.76–1.27)
Calcium: 9.5 mg/dL (ref 8.6–10.2)
Chloride: 103 mmol/L (ref 96–106)
GFR calc Af Amer: 104 mL/min/{1.73_m2} (ref >59)
GFR calc non Af Amer: 90 mL/min/{1.73_m2} (ref >59)
GLUCOSE: 81 mg/dL (ref 65–99)
Potassium: 4.3 mmol/L (ref 3.5–5.2)
Sodium: 140 mmol/L (ref 134–144)

## 2017-09-17 LAB — FECAL OCCULT BLOOD, IMMUNOCHEMICAL: Fecal Occult Bld: NEGATIVE

## 2018-06-10 ENCOUNTER — Other Ambulatory Visit: Payer: Self-pay | Admitting: Pediatrics

## 2018-06-10 DIAGNOSIS — I10 Essential (primary) hypertension: Secondary | ICD-10-CM

## 2018-07-17 DIAGNOSIS — R972 Elevated prostate specific antigen [PSA]: Secondary | ICD-10-CM | POA: Diagnosis not present

## 2018-07-17 DIAGNOSIS — R339 Retention of urine, unspecified: Secondary | ICD-10-CM | POA: Diagnosis not present

## 2018-07-17 DIAGNOSIS — N401 Enlarged prostate with lower urinary tract symptoms: Secondary | ICD-10-CM | POA: Diagnosis not present

## 2018-08-28 ENCOUNTER — Other Ambulatory Visit: Payer: Self-pay

## 2018-08-28 ENCOUNTER — Encounter: Payer: Self-pay | Admitting: Family Medicine

## 2018-08-28 ENCOUNTER — Ambulatory Visit: Payer: BLUE CROSS/BLUE SHIELD | Admitting: Family Medicine

## 2018-08-28 VITALS — BP 148/86 | HR 74 | Temp 97.3°F | Ht >= 80 in | Wt 243.0 lb

## 2018-08-28 DIAGNOSIS — I10 Essential (primary) hypertension: Secondary | ICD-10-CM | POA: Insufficient documentation

## 2018-08-28 DIAGNOSIS — E782 Mixed hyperlipidemia: Secondary | ICD-10-CM

## 2018-08-28 DIAGNOSIS — N401 Enlarged prostate with lower urinary tract symptoms: Secondary | ICD-10-CM | POA: Insufficient documentation

## 2018-08-28 DIAGNOSIS — N400 Enlarged prostate without lower urinary tract symptoms: Secondary | ICD-10-CM | POA: Diagnosis not present

## 2018-08-28 DIAGNOSIS — Z6826 Body mass index (BMI) 26.0-26.9, adult: Secondary | ICD-10-CM | POA: Insufficient documentation

## 2018-08-28 DIAGNOSIS — R972 Elevated prostate specific antigen [PSA]: Secondary | ICD-10-CM

## 2018-08-28 DIAGNOSIS — N138 Other obstructive and reflux uropathy: Secondary | ICD-10-CM | POA: Insufficient documentation

## 2018-08-28 MED ORDER — LISINOPRIL 40 MG PO TABS: 40.0000 mg | ORAL_TABLET | Freq: Every day | ORAL | 2 refills | Status: DC

## 2018-08-28 MED ORDER — HYDROCHLOROTHIAZIDE 12.5 MG PO CAPS: 12.5000 mg | ORAL_CAPSULE | Freq: Every day | ORAL | 3 refills | Status: DC

## 2018-08-28 NOTE — Patient Instructions (Signed)
DASH Eating Plan  DASH stands for "Dietary Approaches to Stop Hypertension." The DASH eating plan is a healthy eating plan that has been shown to reduce high blood pressure (hypertension). It may also reduce your risk for type 2 diabetes, heart disease, and stroke. The DASH eating plan may also help with weight loss.  What are tips for following this plan?    General guidelines   Avoid eating more than 2,300 mg (milligrams) of salt (sodium) a day. If you have hypertension, you may need to reduce your sodium intake to 1,500 mg a day.   Limit alcohol intake to no more than 1 drink a day for nonpregnant women and 2 drinks a day for men. One drink equals 12 oz of beer, 5 oz of wine, or 1 oz of hard liquor.   Work with your health care provider to maintain a healthy body weight or to lose weight. Ask what an ideal weight is for you.   Get at least 30 minutes of exercise that causes your heart to beat faster (aerobic exercise) most days of the week. Activities may include walking, swimming, or biking.   Work with your health care provider or diet and nutrition specialist (dietitian) to adjust your eating plan to your individual calorie needs.  Reading food labels     Check food labels for the amount of sodium per serving. Choose foods with less than 5 percent of the Daily Value of sodium. Generally, foods with less than 300 mg of sodium per serving fit into this eating plan.   To find whole grains, look for the word "whole" as the first word in the ingredient list.  Shopping   Buy products labeled as "low-sodium" or "no salt added."   Buy fresh foods. Avoid canned foods and premade or frozen meals.  Cooking   Avoid adding salt when cooking. Use salt-free seasonings or herbs instead of table salt or sea salt. Check with your health care provider or pharmacist before using salt substitutes.   Do not fry foods. Cook foods using healthy methods such as baking, boiling, grilling, and broiling instead.   Cook with  heart-healthy oils, such as olive, canola, soybean, or sunflower oil.  Meal planning   Eat a balanced diet that includes:  ? 5 or more servings of fruits and vegetables each day. At each meal, try to fill half of your plate with fruits and vegetables.  ? Up to 6-8 servings of whole grains each day.  ? Less than 6 oz of lean meat, poultry, or fish each day. A 3-oz serving of meat is about the same size as a deck of cards. One egg equals 1 oz.  ? 2 servings of low-fat dairy each day.  ? A serving of nuts, seeds, or beans 5 times each week.  ? Heart-healthy fats. Healthy fats called Omega-3 fatty acids are found in foods such as flaxseeds and coldwater fish, like sardines, salmon, and mackerel.   Limit how much you eat of the following:  ? Canned or prepackaged foods.  ? Food that is high in trans fat, such as fried foods.  ? Food that is high in saturated fat, such as fatty meat.  ? Sweets, desserts, sugary drinks, and other foods with added sugar.  ? Full-fat dairy products.   Do not salt foods before eating.   Try to eat at least 2 vegetarian meals each week.   Eat more home-cooked food and less restaurant, buffet, and fast food.     When eating at a restaurant, ask that your food be prepared with less salt or no salt, if possible.  What foods are recommended?  The items listed may not be a complete list. Talk with your dietitian about what dietary choices are best for you.  Grains  Whole-grain or whole-wheat bread. Whole-grain or whole-wheat pasta. Brown rice. Oatmeal. Quinoa. Bulgur. Whole-grain and low-sodium cereals. Pita bread. Low-fat, low-sodium crackers. Whole-wheat flour tortillas.  Vegetables  Fresh or frozen vegetables (raw, steamed, roasted, or grilled). Low-sodium or reduced-sodium tomato and vegetable juice. Low-sodium or reduced-sodium tomato sauce and tomato paste. Low-sodium or reduced-sodium canned vegetables.  Fruits  All fresh, dried, or frozen fruit. Canned fruit in natural juice (without  added sugar).  Meat and other protein foods  Skinless chicken or turkey. Ground chicken or turkey. Pork with fat trimmed off. Fish and seafood. Egg whites. Dried beans, peas, or lentils. Unsalted nuts, nut butters, and seeds. Unsalted canned beans. Lean cuts of beef with fat trimmed off. Low-sodium, lean deli meat.  Dairy  Low-fat (1%) or fat-free (skim) milk. Fat-free, low-fat, or reduced-fat cheeses. Nonfat, low-sodium ricotta or cottage cheese. Low-fat or nonfat yogurt. Low-fat, low-sodium cheese.  Fats and oils  Soft margarine without trans fats. Vegetable oil. Low-fat, reduced-fat, or light mayonnaise and salad dressings (reduced-sodium). Canola, safflower, olive, soybean, and sunflower oils. Avocado.  Seasoning and other foods  Herbs. Spices. Seasoning mixes without salt. Unsalted popcorn and pretzels. Fat-free sweets.  What foods are not recommended?  The items listed may not be a complete list. Talk with your dietitian about what dietary choices are best for you.  Grains  Baked goods made with fat, such as croissants, muffins, or some breads. Dry pasta or rice meal packs.  Vegetables  Creamed or fried vegetables. Vegetables in a cheese sauce. Regular canned vegetables (not low-sodium or reduced-sodium). Regular canned tomato sauce and paste (not low-sodium or reduced-sodium). Regular tomato and vegetable juice (not low-sodium or reduced-sodium). Pickles. Olives.  Fruits  Canned fruit in a light or heavy syrup. Fried fruit. Fruit in cream or butter sauce.  Meat and other protein foods  Fatty cuts of meat. Ribs. Fried meat. Bacon. Sausage. Bologna and other processed lunch meats. Salami. Fatback. Hotdogs. Bratwurst. Salted nuts and seeds. Canned beans with added salt. Canned or smoked fish. Whole eggs or egg yolks. Chicken or turkey with skin.  Dairy  Whole or 2% milk, cream, and half-and-half. Whole or full-fat cream cheese. Whole-fat or sweetened yogurt. Full-fat cheese. Nondairy creamers. Whipped toppings.  Processed cheese and cheese spreads.  Fats and oils  Butter. Stick margarine. Lard. Shortening. Ghee. Bacon fat. Tropical oils, such as coconut, palm kernel, or palm oil.  Seasoning and other foods  Salted popcorn and pretzels. Onion salt, garlic salt, seasoned salt, table salt, and sea salt. Worcestershire sauce. Tartar sauce. Barbecue sauce. Teriyaki sauce. Soy sauce, including reduced-sodium. Steak sauce. Canned and packaged gravies. Fish sauce. Oyster sauce. Cocktail sauce. Horseradish that you find on the shelf. Ketchup. Mustard. Meat flavorings and tenderizers. Bouillon cubes. Hot sauce and Tabasco sauce. Premade or packaged marinades. Premade or packaged taco seasonings. Relishes. Regular salad dressings.  Where to find more information:   National Heart, Lung, and Blood Institute: www.nhlbi.nih.gov   American Heart Association: www.heart.org  Summary   The DASH eating plan is a healthy eating plan that has been shown to reduce high blood pressure (hypertension). It may also reduce your risk for type 2 diabetes, heart disease, and stroke.   With the   DASH eating plan, you should limit salt (sodium) intake to 2,300 mg a day. If you have hypertension, you may need to reduce your sodium intake to 1,500 mg a day.   When on the DASH eating plan, aim to eat more fresh fruits and vegetables, whole grains, lean proteins, low-fat dairy, and heart-healthy fats.   Work with your health care provider or diet and nutrition specialist (dietitian) to adjust your eating plan to your individual calorie needs.  This information is not intended to replace advice given to you by your health care provider. Make sure you discuss any questions you have with your health care provider.  Document Released: 05/16/2011 Document Revised: 05/20/2016 Document Reviewed: 05/20/2016  Elsevier Interactive Patient Education  2019 [employer]

## 2018-08-28 NOTE — Progress Notes (Signed)
Subjective:  Patient ID: Adam Reeves, male    DOB: 10/09/1955, 63 y.o.   MRN: [id number]  Chief Complaint:  Medical Management of Chronic Issues   HPI: Adam Reeves is a 63 y.o. male presenting on 08/28/2018 for Medical Management of Chronic Issues   1. Essential (primary) hypertension  Complaint with meds - Yes Checking BP at home - No Exercising Regularly - No Watching Salt intake - No Pertinent ROS:  Headache - No Chest pain - No Dyspnea - No Palpitations - No LE edema - No They report good compliance with medications and can restate their regimen by memory. No medication side effects.  BP Readings from Last 3 Encounters:  08/28/18 (!) 148/86  09/15/17 137/84  09/01/17 (!) 156/91     2. BMI 26.0-26.9,adult  Does not diet and exercise on a regular basis.    3. BPH with elevated PSA  Followed by urology. Compliant with medications without associated side effects. States he has not had any procedures and does not have urinary retention. No hematuria or dysuria. No rectal pressure or pain. No fever, chills, weakness, or confusion.      Relevant past medical, surgical, family, and social history reviewed and updated as indicated.  Allergies and medications reviewed and updated.   Past Medical History:  Diagnosis Date  . Hypertension     Past Surgical History:  Procedure Laterality Date  . BACK SURGERY    . HERNIA REPAIR      Social History   Socioeconomic History  . Marital status: Divorced    Spouse name: Not on file  . Number of children: 4  . Years of education: Not on file  . Highest education level: Not on file  Occupational History  . Not on file  Social Needs  . Financial resource strain: Not hard at all  . Food insecurity:    Worry: Never true    Inability: Never true  . Transportation needs:    Medical: No    Non-medical: No  Tobacco Use  . Smoking status: Never Smoker  . Smokeless tobacco: Never Used  Substance and Sexual  Activity  . Alcohol use: Yes    Comment: occasional  . Drug use: No  . Sexual activity: Yes  Lifestyle  . Physical activity:    Days per week: 0 days    Minutes per session: 0 min  . Stress: Not at all  Relationships  . Social connections:    Talks on phone: Once a week    Gets together: Once a week    Attends religious service: 1 to 4 times per year    Active member of club or organization: No    Attends meetings of clubs or organizations: Never    Relationship status: Divorced  . Intimate partner violence:    Fear of current or ex partner: No    Emotionally abused: No    Physically abused: No    Forced sexual activity: No  Other Topics Concern  . Not on file  Social History Narrative  . Not on file    Outpatient Encounter Medications as of 08/28/2018  Medication Sig  . lisinopril (PRINIVIL,ZESTRIL) 40 MG tablet Take 1 tablet (40 mg total) by mouth daily.  . [DISCONTINUED] lisinopril (PRINIVIL,ZESTRIL) 40 MG tablet Take 1 tablet (40 mg total) by mouth daily.  . bethanechol (URECHOLINE) 25 MG tablet TAKE 1 TABLET BY MOUTH TWICE DAILY URINARY RETENTION  . finasteride (PROSCAR) 5 MG  tablet Take 5 mg by mouth daily.  . hydrochlorothiazide (MICROZIDE) 12.5 MG capsule Take 1 capsule (12.5 mg total) by mouth daily.  . silodosin (RAPAFLO) 8 MG CAPS capsule   . [DISCONTINUED] traZODone (DESYREL) 50 MG tablet Take 0.5-1 tablets (25-50 mg total) by mouth at bedtime as needed for sleep.   No facility-administered encounter medications on file as of 08/28/2018.     No Known Allergies  Review of Systems  Constitutional: Negative for activity change, appetite change, chills, fatigue, fever and unexpected weight change.  Eyes: Negative for photophobia and visual disturbance.  Respiratory: Negative for cough, chest tightness and shortness of breath.   Cardiovascular: Negative for chest pain, palpitations and leg swelling.  Gastrointestinal: Negative for abdominal pain, anal bleeding,  blood in stool, constipation and rectal pain.  Endocrine: Negative for cold intolerance, heat intolerance, polydipsia, polyphagia and polyuria.  Genitourinary: Positive for difficulty urinating. Negative for decreased urine volume, dysuria, enuresis, flank pain, frequency, hematuria, penile pain, penile swelling, scrotal swelling, testicular pain and urgency.  Skin: Negative for color change.  Neurological: Negative for dizziness, tremors, seizures, syncope, facial asymmetry, speech difficulty, weakness, light-headedness, numbness and headaches.  Hematological: Does not bruise/bleed easily.  Psychiatric/Behavioral: Negative for confusion.  All other systems reviewed and are negative.       Objective:  BP (!) 148/86   Pulse 74   Temp (!) 97.3 F (36.3 C) (Oral)   Ht '6\' 8"'$  (2.032 m)   Wt 243 lb (110.2 kg)   BMI 26.69 kg/m    Wt Readings from Last 3 Encounters:  08/28/18 243 lb (110.2 kg)  09/15/17 238 lb 3.2 oz (108 kg)  09/01/17 241 lb (109.3 kg)    Physical Exam Vitals signs and nursing note reviewed.  Constitutional:      General: He is not in acute distress.    Appearance: Normal appearance. He is well-developed and well-groomed. He is not ill-appearing or toxic-appearing.  HENT:     Head: Normocephalic and atraumatic.     Jaw: There is normal jaw occlusion.     Right Ear: Tympanic membrane, ear canal and external ear normal.     Left Ear: Tympanic membrane, ear canal and external ear normal.     Nose: Nose normal.     Mouth/Throat:     Lips: Pink.     Mouth: Mucous membranes are moist.     Pharynx: Oropharynx is clear. No oropharyngeal exudate or posterior oropharyngeal erythema.  Eyes:     General: Lids are normal.     Extraocular Movements: Extraocular movements intact.     Conjunctiva/sclera: Conjunctivae normal.     Pupils: Pupils are equal, round, and reactive to light.  Neck:     Musculoskeletal: Normal range of motion and neck supple.     Thyroid: No  thyroid mass, thyromegaly or thyroid tenderness.     Vascular: No carotid bruit or JVD.     Trachea: Trachea and phonation normal.  Cardiovascular:     Rate and Rhythm: Normal rate and regular rhythm.     Chest Wall: PMI is not displaced.     Pulses: Normal pulses.     Heart sounds: Normal heart sounds. No murmur. No friction rub. No gallop.   Pulmonary:     Effort: Pulmonary effort is normal. No respiratory distress.     Breath sounds: Normal breath sounds.  Musculoskeletal:     Right lower leg: No edema.     Left lower leg: No edema.  Lymphadenopathy:  Cervical: No cervical adenopathy.  Skin:    General: Skin is warm and dry.     Capillary Refill: Capillary refill takes less than 2 seconds.  Neurological:     General: No focal deficit present.     Mental Status: He is alert and oriented to person, place, and time.     Cranial Nerves: Cranial nerves are intact. No cranial nerve deficit.     Sensory: Sensation is intact. No sensory deficit.     Motor: Motor function is intact. No weakness.     Coordination: Coordination is intact. Coordination normal.     Gait: Gait is intact. Gait normal.     Deep Tendon Reflexes: Reflexes are normal and symmetric. Reflexes normal.  Psychiatric:        Mood and Affect: Mood normal.        Behavior: Behavior normal. Behavior is cooperative.        Thought Content: Thought content normal.        Judgment: Judgment normal.     Results for orders placed or performed in visit on 09/15/17  Fecal occult blood, imunochemical  Result Value Ref Range   Fecal Occult Bld Negative Negative  BMP8+EGFR  Result Value Ref Range   Glucose 81 65 - 99 mg/dL   BUN 14 8 - 27 mg/dL   Creatinine, Ser 0.91 0.76 - 1.27 mg/dL   GFR calc non Af Amer 90 >59 mL/min/1.73   GFR calc Af Amer 104 >59 mL/min/1.73   BUN/Creatinine Ratio 15 10 - 24   Sodium 140 134 - 144 mmol/L   Potassium 4.3 3.5 - 5.2 mmol/L   Chloride 103 96 - 106 mmol/L   CO2 22 20 - 29 mmol/L    Calcium 9.5 8.6 - 10.2 mg/dL       Pertinent labs & imaging results that were available during my care of the patient were reviewed by me and considered in my medical decision making.  Assessment & Plan:  [place] was seen today for medical management of chronic issues.  Diagnoses and all orders for this visit:  Essential (primary) hypertension Diet and exercise encouraged. Due to continued elevated BP will add 12.5 mg HCTZ daily. Return in 2 weeks for repeat CMP and BP check. Report any persistent highs or lows.  -     CMP14+EGFR -     CBC with Differential/Platelet -     Lipid panel -     TSH -     lisinopril (PRINIVIL,ZESTRIL) 40 MG tablet; Take 1 tablet (40 mg total) by mouth daily. -     hydrochlorothiazide (MICROZIDE) 12.5 MG capsule; Take 1 capsule (12.5 mg total) by mouth daily.  BMI 26.0-26.9,adult Diet and exercise encouraged. Labs pending.  -     CMP14+EGFR -     CBC with Differential/Platelet -     Lipid panel -     TSH  BPH with elevated PSA Followed by urology. Tolerating medications well.     Continue all other maintenance medications.  Follow up plan: Return in about 2 weeks (around 09/11/2018), or if symptoms worsen or fail to improve, for HTN, CMP.  Educational handout given for DASH diet  The above assessment and management plan was discussed with the patient. The patient verbalized understanding of and has agreed to the management plan. Patient is aware to call the clinic if symptoms persist or worsen. Patient is aware when to return to the clinic for a follow-up visit. Patient educated on when it  is appropriate to go to the emergency department.   Monia Pouch, FNP-C [facility] [phone]

## 2018-08-29 LAB — TSH: TSH: 2.13 u[IU]/mL (ref 0.450–4.500)

## 2018-08-29 LAB — CBC WITH DIFFERENTIAL/PLATELET
BASOS ABS: 0 10*3/uL (ref 0.0–0.2)
Basos: 1 %
EOS (ABSOLUTE): 0.2 10*3/uL (ref 0.0–0.4)
Eos: 3 %
HEMATOCRIT: 42.6 % (ref 37.5–51.0)
HEMOGLOBIN: 14.7 g/dL (ref 13.0–17.7)
IMMATURE GRANS (ABS): 0.1 10*3/uL (ref 0.0–0.1)
IMMATURE GRANULOCYTES: 1 %
LYMPHS: 26 %
Lymphocytes Absolute: 1.5 10*3/uL (ref 0.7–3.1)
MCH: 30 pg (ref 26.6–33.0)
MCHC: 34.5 g/dL (ref 31.5–35.7)
MCV: 87 fL (ref 79–97)
MONOCYTES: 10 %
Monocytes Absolute: 0.6 10*3/uL (ref 0.1–0.9)
NEUTROS PCT: 59 %
Neutrophils Absolute: 3.3 10*3/uL (ref 1.4–7.0)
Platelets: 209 10*3/uL (ref 150–450)
RBC: 4.9 x10E6/uL (ref 4.14–5.80)
RDW: 12.5 % (ref 11.6–15.4)
WBC: 5.6 10*3/uL (ref 3.4–10.8)

## 2018-08-29 LAB — CMP14+EGFR
ALBUMIN: 4.5 g/dL (ref 3.8–4.8)
ALK PHOS: 78 IU/L (ref 39–117)
ALT: 34 IU/L (ref 0–44)
AST: 23 IU/L (ref 0–40)
Albumin/Globulin Ratio: 1.9 (ref 1.2–2.2)
BUN / CREAT RATIO: 15 (ref 10–24)
BUN: 14 mg/dL (ref 8–27)
Bilirubin Total: 0.3 mg/dL (ref 0.0–1.2)
CO2: 23 mmol/L (ref 20–29)
Calcium: 9.3 mg/dL (ref 8.6–10.2)
Chloride: 104 mmol/L (ref 96–106)
Creatinine, Ser: 0.93 mg/dL (ref 0.76–1.27)
GFR calc Af Amer: 101 mL/min/{1.73_m2} (ref >59)
GFR calc non Af Amer: 87 mL/min/{1.73_m2} (ref >59)
GLUCOSE: 81 mg/dL (ref 65–99)
Globulin, Total: 2.4 g/dL (ref 1.5–4.5)
Potassium: 4.3 mmol/L (ref 3.5–5.2)
Sodium: 141 mmol/L (ref 134–144)
Total Protein: 6.9 g/dL (ref 6.0–8.5)

## 2018-08-29 LAB — LIPID PANEL
CHOL/HDL RATIO: 5.6 ratio — AB (ref 0.0–5.0)
CHOLESTEROL TOTAL: 190 mg/dL (ref 100–199)
HDL: 34 mg/dL — AB (ref >39)
LDL CALC: 109 mg/dL — AB (ref 0–99)
TRIGLYCERIDES: 236 mg/dL — AB (ref 0–149)
VLDL Cholesterol Cal: 47 mg/dL — ABNORMAL HIGH (ref 5–40)

## 2018-08-29 MED ORDER — ATORVASTATIN CALCIUM 40 MG PO TABS: 40.0000 mg | ORAL_TABLET | Freq: Every day | ORAL | 3 refills | Status: DC

## 2018-08-29 NOTE — Addendum Note (Signed)
Addended by: Sonny Masters on: 08/29/2018 11:22 AM   Modules accepted: Orders

## 2018-10-20 ENCOUNTER — Telehealth: Payer: Self-pay | Admitting: *Deleted

## 2018-10-20 NOTE — Telephone Encounter (Signed)
Left message for patient to call back to set up follow up for hypertension

## 2018-10-23 DIAGNOSIS — N401 Enlarged prostate with lower urinary tract symptoms: Secondary | ICD-10-CM | POA: Diagnosis not present

## 2018-10-23 DIAGNOSIS — R972 Elevated prostate specific antigen [PSA]: Secondary | ICD-10-CM | POA: Diagnosis not present

## 2018-10-23 DIAGNOSIS — R339 Retention of urine, unspecified: Secondary | ICD-10-CM | POA: Diagnosis not present

## 2018-12-28 ENCOUNTER — Other Ambulatory Visit: Payer: Self-pay | Admitting: Family Medicine

## 2018-12-28 DIAGNOSIS — I10 Essential (primary) hypertension: Secondary | ICD-10-CM

## 2019-01-23 ENCOUNTER — Other Ambulatory Visit: Payer: Self-pay | Admitting: Family Medicine

## 2019-01-23 DIAGNOSIS — I10 Essential (primary) hypertension: Secondary | ICD-10-CM

## 2019-03-12 DIAGNOSIS — R339 Retention of urine, unspecified: Secondary | ICD-10-CM | POA: Diagnosis not present

## 2019-03-12 DIAGNOSIS — N401 Enlarged prostate with lower urinary tract symptoms: Secondary | ICD-10-CM | POA: Diagnosis not present

## 2019-03-12 DIAGNOSIS — R972 Elevated prostate specific antigen [PSA]: Secondary | ICD-10-CM | POA: Diagnosis not present

## 2019-05-30 ENCOUNTER — Other Ambulatory Visit: Payer: Self-pay | Admitting: Family Medicine

## 2019-05-30 DIAGNOSIS — I10 Essential (primary) hypertension: Secondary | ICD-10-CM

## 2019-08-27 ENCOUNTER — Other Ambulatory Visit: Payer: Self-pay | Admitting: Family Medicine

## 2019-08-27 DIAGNOSIS — I10 Essential (primary) hypertension: Secondary | ICD-10-CM

## 2019-09-23 ENCOUNTER — Other Ambulatory Visit: Payer: Self-pay | Admitting: Family Medicine

## 2019-09-23 DIAGNOSIS — E782 Mixed hyperlipidemia: Secondary | ICD-10-CM

## 2019-10-13 ENCOUNTER — Other Ambulatory Visit: Payer: Self-pay | Admitting: *Deleted

## 2019-10-13 DIAGNOSIS — I10 Essential (primary) hypertension: Secondary | ICD-10-CM

## 2019-10-13 NOTE — Telephone Encounter (Signed)
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

## 2019-10-15 NOTE — Telephone Encounter (Signed)
NA , no VM -jhb 10/15/19

## 2019-10-18 NOTE — Telephone Encounter (Signed)
No answer, no option to leave message

## 2019-10-19 NOTE — Telephone Encounter (Signed)
Appt made.  Patient aware  

## 2019-10-25 ENCOUNTER — Ambulatory Visit: Payer: PRIVATE HEALTH INSURANCE | Admitting: Nurse Practitioner

## 2019-10-25 ENCOUNTER — Encounter: Payer: Self-pay | Admitting: Nurse Practitioner

## 2019-10-25 ENCOUNTER — Other Ambulatory Visit: Payer: Self-pay

## 2019-10-25 DIAGNOSIS — I10 Essential (primary) hypertension: Secondary | ICD-10-CM | POA: Diagnosis not present

## 2019-10-25 DIAGNOSIS — N400 Enlarged prostate without lower urinary tract symptoms: Secondary | ICD-10-CM | POA: Diagnosis not present

## 2019-10-25 DIAGNOSIS — R972 Elevated prostate specific antigen [PSA]: Secondary | ICD-10-CM

## 2019-10-25 DIAGNOSIS — Z6826 Body mass index (BMI) 26.0-26.9, adult: Secondary | ICD-10-CM

## 2019-10-25 DIAGNOSIS — E782 Mixed hyperlipidemia: Secondary | ICD-10-CM

## 2019-10-25 MED ORDER — ATORVASTATIN CALCIUM 40 MG PO TABS: 40.0000 mg | ORAL_TABLET | Freq: Every day | ORAL | 1 refills | Status: DC

## 2019-10-25 MED ORDER — BETHANECHOL CHLORIDE 25 MG PO TABS: 25.0000 mg | ORAL_TABLET | Freq: Two times a day (BID) | ORAL | 3 refills | Status: DC

## 2019-10-25 MED ORDER — SILODOSIN 8 MG PO CAPS: 8.0000 mg | ORAL_CAPSULE | Freq: Every day | ORAL | 3 refills | Status: DC

## 2019-10-25 MED ORDER — HYDROCHLOROTHIAZIDE 25 MG PO TABS
25.0000 mg | ORAL_TABLET | Freq: Every day | ORAL | 1 refills | Status: DC
Start: 2019-10-25 — End: 2020-06-16

## 2019-10-25 MED ORDER — FINASTERIDE 5 MG PO TABS: 5.0000 mg | ORAL_TABLET | Freq: Every day | ORAL | 1 refills | Status: DC

## 2019-10-25 MED ORDER — LISINOPRIL 40 MG PO TABS: 40.0000 mg | ORAL_TABLET | Freq: Every day | ORAL | 0 refills | Status: DC

## 2019-10-25 NOTE — Progress Notes (Signed)
Established Patient Office Visit  Subjective:  Patient ID: Adam Reeves, male    DOB: Jul 29, 1955  Age: 64 y.o. MRN: [id number]  CC:  Chief Complaint  Patient presents with  . Hypertension    check up of chronic medical conditons    HPI Adam Reeves presents for follow up  hypertension. Patient was diagnosed a few years ago. The patient is currently on Zestril 40 mg daily, and Microzide 12.5 mg daily. Patient is tolerating the medication well without side effects, but blood pressures are still elevated. Compliance with treatment has been good; including taking medication as directed. Maintains a healthy diet and regular exercise regimen and following up as directed.  Past Medical History:  Diagnosis Date  . Hypertension     Past Surgical History:  Procedure Laterality Date  . BACK SURGERY    . HERNIA REPAIR      History reviewed. No pertinent family history.  Social History   Socioeconomic History  . Marital status: Divorced    Spouse name: Not on file  . Number of children: 4  . Years of education: Not on file  . Highest education level: Not on file  Occupational History  . Not on file  Tobacco Use  . Smoking status: Never Smoker  . Smokeless tobacco: Never Used  Substance and Sexual Activity  . Alcohol use: Yes    Comment: occasional  . Drug use: No  . Sexual activity: Yes  Other Topics Concern  . Not on file  Social History Narrative  . Not on file   Social Determinants of Health   Financial Resource Strain:   . Difficulty of Paying Living Expenses:   Food Insecurity:   . Worried About Charity fundraiser in the Last Year:   . Arboriculturist in the Last Year:   Transportation Needs:   . Film/video editor (Medical):   Marland Kitchen Lack of Transportation (Non-Medical):   Physical Activity:   . Days of Exercise per Week:   . Minutes of Exercise per Session:   Stress:   . Feeling of Stress :   Social Connections:   . Frequency of Communication with  Friends and Family:   . Frequency of Social Gatherings with Friends and Family:   . Attends Religious Services:   . Active Member of Clubs or Organizations:   . Attends Archivist Meetings:   Marland Kitchen Marital Status:   Intimate Partner Violence:   . Fear of Current or Ex-Partner:   . Emotionally Abused:   Marland Kitchen Physically Abused:   . Sexually Abused:     Outpatient Medications Prior to Visit  Medication Sig Dispense Refill  . atorvastatin (LIPITOR) 40 MG tablet Take 1 tablet by mouth once daily 90 tablet 0  . bethanechol (URECHOLINE) 25 MG tablet TAKE 1 TABLET BY MOUTH TWICE DAILY URINARY RETENTION    . finasteride (PROSCAR) 5 MG tablet Take 5 mg by mouth daily.    . hydrochlorothiazide (MICROZIDE) 12.5 MG capsule Take 1 capsule (12.5 mg total) by mouth daily. (Please make 6 mos Sept appt) 30 capsule 0  . lisinopril (ZESTRIL) 40 MG tablet Take 1 tablet (40 mg total) by mouth daily. Needs to be seen for further refills. 30 tablet 0  . silodosin (RAPAFLO) 8 MG CAPS capsule      No facility-administered medications prior to visit.    No Known Allergies  ROS Review of Systems  Constitutional: Negative for activity change and appetite change.  HENT: Negative.   Eyes: Negative.   Respiratory: Negative for chest tightness and shortness of breath.   Cardiovascular: Negative for chest pain, palpitations and leg swelling.  Gastrointestinal: Negative for abdominal distention.  Endocrine: Negative.   Genitourinary: Negative for difficulty urinating.  Musculoskeletal: Negative for arthralgias and myalgias.  Skin: Negative for color change and rash.      Objective:    Physical Exam  Constitutional: He is oriented to person, place, and time. He appears well-developed and well-nourished.  HENT:  Head: Normocephalic.  Mouth/Throat: Oropharynx is clear and moist.  Eyes: Conjunctivae are normal.  Cardiovascular: Normal rate and regular rhythm.  Pulmonary/Chest: Effort normal and breath  sounds normal.  Abdominal: Bowel sounds are normal.  Musculoskeletal:        General: No tenderness.     Cervical back: Neck supple.  Neurological: He is alert and oriented to person, place, and time.  Skin: Skin is warm. No rash noted.  Psychiatric: He has a normal mood and affect. His behavior is normal.    BP (!) 161/79   Pulse 75   Temp 98.2 F (36.8 C) (Temporal)   Ht '6\' 8"'$  (2.032 m)   Wt 243 lb (110.2 kg)   SpO2 98%   BMI 26.69 kg/m  Wt Readings from Last 3 Encounters:  10/25/19 243 lb (110.2 kg)  08/28/18 243 lb (110.2 kg)  09/15/17 238 lb 3.2 oz (108 kg)        Lab Results  Component Value Date   TSH 2.130 08/28/2018   Lab Results  Component Value Date   WBC 5.6 08/28/2018   HGB 14.7 08/28/2018   HCT 42.6 08/28/2018   MCV 87 08/28/2018   PLT 209 08/28/2018   Lab Results  Component Value Date   NA 141 08/28/2018   K 4.3 08/28/2018   CO2 23 08/28/2018   GLUCOSE 81 08/28/2018   BUN 14 08/28/2018   CREATININE 0.93 08/28/2018   BILITOT 0.3 08/28/2018   ALKPHOS 78 08/28/2018   AST 23 08/28/2018   ALT 34 08/28/2018   PROT 6.9 08/28/2018   ALBUMIN 4.5 08/28/2018   CALCIUM 9.3 08/28/2018   Lab Results  Component Value Date   CHOL 190 08/28/2018   Lab Results  Component Value Date   HDL 34 (L) 08/28/2018   Lab Results  Component Value Date   LDLCALC 109 (H) 08/28/2018   Lab Results  Component Value Date   TRIG 236 (H) 08/28/2018   Lab Results  Component Value Date   CHOLHDL 5.6 (H) 08/28/2018   No results found for: HGBA1C    Assessment & Plan:   Problem List Items Addressed This Visit      Cardiovascular and Mediastinum   Essential (primary) hypertension    Hypertension not well controlled, provided education on diet and exercise modification, reducing salt intake, taking medication as prescribed, changed dose for hydrochlorothiazide from 12.5 mg to 25 mg daily. Educated patient to keep a blood pressure log for a week, call in  results, and follow up in two weeks.       Relevant Medications   lisinopril (ZESTRIL) 40 MG tablet   atorvastatin (LIPITOR) 40 MG tablet   hydrochlorothiazide (HYDRODIURIL) 25 MG tablet   Other Relevant Orders   CBC with Differential   CMP14+EGFR   Lipid Panel    Other Visit Diagnoses    Mixed hyperlipidemia       Relevant Medications   lisinopril (ZESTRIL) 40 MG tablet   atorvastatin (  LIPITOR) 40 MG tablet   hydrochlorothiazide (HYDRODIURIL) 25 MG tablet      Meds ordered this encounter  Medications  . lisinopril (ZESTRIL) 40 MG tablet    Sig: Take 1 tablet (40 mg total) by mouth daily. Needs to be seen for further refills.    Dispense:  30 tablet    Refill:  0  . atorvastatin (LIPITOR) 40 MG tablet    Sig: Take 1 tablet (40 mg total) by mouth daily.    Dispense:  90 tablet    Refill:  1  . silodosin (RAPAFLO) 8 MG CAPS capsule    Sig: Take 1 capsule (8 mg total) by mouth daily with breakfast.    Dispense:  30 capsule    Refill:  3  . finasteride (PROSCAR) 5 MG tablet    Sig: Take 1 tablet (5 mg total) by mouth daily.    Dispense:  90 tablet    Refill:  1  . hydrochlorothiazide (HYDRODIURIL) 25 MG tablet    Sig: Take 1 tablet (25 mg total) by mouth daily.    Dispense:  90 tablet    Refill:  1    Order Specific Question:   Supervising Provider    Answer:   Caryl Pina A [id number]  . bethanechol (URECHOLINE) 25 MG tablet    Sig: Take 1 tablet (25 mg total) by mouth in the morning and at bedtime.    Dispense:  30 tablet    Refill:  3    Order Specific Question:   Supervising Provider    Answer:   Caryl Pina A [[id number]]    Follow-up: Return in about 2 weeks (around 11/08/2019) for change medication dose HTN.    Ivy Lynn, NP

## 2019-10-25 NOTE — Assessment & Plan Note (Signed)
Well controlled no changes to current medication.

## 2019-10-25 NOTE — Assessment & Plan Note (Signed)
Not well managed, provided education on exercise and diet modification.

## 2019-10-25 NOTE — Patient Instructions (Addendum)
Hypertension not well controlled, provided education on diet and exercise modification, reducing salt intake, taking medication as prescribed, changed dose for hydrochlorothiazide from 12.5 mg to 25 mg daily. Educated patient to keep a blood pressure log for a week, call in results, and follow up in two weeks.     Hypertension, Adult Hypertension is another name for high blood pressure. High blood pressure forces your heart to work harder to pump blood. This can cause problems over time. There are two numbers in a blood pressure reading. There is a top number (systolic) over a bottom number (diastolic). It is best to have a blood pressure that is below 120/80. Healthy choices can help lower your blood pressure, or you may need medicine to help lower it. What are the causes? The cause of this condition is not known. Some conditions may be related to high blood pressure. What increases the risk?  Smoking.  Having type 2 diabetes mellitus, high cholesterol, or both.  Not getting enough exercise or physical activity.  Being overweight.  Having too much fat, sugar, calories, or salt (sodium) in your diet.  Drinking too much alcohol.  Having long-term (chronic) kidney disease.  Having a family history of high blood pressure.  Age. Risk increases with age.  Race. You may be at higher risk if you are African American.  Gender. Men are at higher risk than women before age 29. After age 4, women are at higher risk than men.  Having obstructive sleep apnea.  Stress. What are the signs or symptoms?  High blood pressure may not cause symptoms. Very high blood pressure (hypertensive crisis) may cause: ? Headache. ? Feelings of worry or nervousness (anxiety). ? Shortness of breath. ? Nosebleed. ? A feeling of being sick to your stomach (nausea). ? Throwing up (vomiting). ? Changes in how you see. ? Very bad chest pain. ? Seizures. How is this treated?  This condition is treated by  making healthy lifestyle changes, such as: ? Eating healthy foods. ? Exercising more. ? Drinking less alcohol.  Your health care provider may prescribe medicine if lifestyle changes are not enough to get your blood pressure under control, and if: ? Your top number is above 130. ? Your bottom number is above 80.  Your personal target blood pressure may vary. Follow these instructions at home: Eating and drinking   If told, follow the DASH eating plan. To follow this plan: ? Fill one half of your plate at each meal with fruits and vegetables. ? Fill one fourth of your plate at each meal with whole grains. Whole grains include whole-wheat pasta, brown rice, and whole-grain bread. ? Eat or drink low-fat dairy products, such as skim milk or low-fat yogurt. ? Fill one fourth of your plate at each meal with low-fat (lean) proteins. Low-fat proteins include fish, chicken without skin, eggs, beans, and tofu. ? Avoid fatty meat, cured and processed meat, or chicken with skin. ? Avoid pre-made or processed food.  Eat less than 1,500 mg of salt each day.  Do not drink alcohol if: ? Your doctor tells you not to drink. ? You are pregnant, may be pregnant, or are planning to become pregnant.  If you drink alcohol: ? Limit how much you use to:  0-1 drink a day for women.  0-2 drinks a day for men. ? Be aware of how much alcohol is in your drink. In the [place] one drink equals one 12 oz bottle of beer (355 mL), one  5 oz glass of wine (148 mL), or one 1 oz glass of hard liquor (44 mL). Lifestyle   Work with your doctor to stay at a healthy weight or to lose weight. Ask your doctor what the best weight is for you.  Get at least 30 minutes of exercise most days of the week. This may include walking, swimming, or biking.  Get at least 30 minutes of exercise that strengthens your muscles (resistance exercise) at least 3 days a week. This may include lifting weights or doing Pilates.  Do not use  any products that contain nicotine or tobacco, such as cigarettes, e-cigarettes, and chewing tobacco. If you need help quitting, ask your doctor.  Check your blood pressure at home as told by your doctor.  Keep all follow-up visits as told by your doctor. This is important. Medicines  Take over-the-counter and prescription medicines only as told by your doctor. Follow directions carefully.  Do not skip doses of blood pressure medicine. The medicine does not work as well if you skip doses. Skipping doses also puts you at risk for problems.  Ask your doctor about side effects or reactions to medicines that you should watch for. Contact a doctor if you:  Think you are having a reaction to the medicine you are taking.  Have headaches that keep coming back (recurring).  Feel dizzy.  Have swelling in your ankles.  Have trouble with your vision. Get help right away if you:  Get a very bad headache.  Start to feel mixed up (confused).  Feel weak or numb.  Feel faint.  Have very bad pain in your: ? Chest. ? Belly (abdomen).  Throw up more than once.  Have trouble breathing. Summary  Hypertension is another name for high blood pressure.  High blood pressure forces your heart to work harder to pump blood.  For most people, a normal blood pressure is less than 120/80.  Making healthy choices can help lower blood pressure. If your blood pressure does not get lower with healthy choices, you may need to take medicine. This information is not intended to replace advice given to you by your health care provider. Make sure you discuss any questions you have with your health care provider. Document Revised: 02/04/2018 Document Reviewed: 02/04/2018 Elsevier Patient Education  2020 [employer]  Calorie Counting for [employer] Calories are units of energy. Your body needs a certain amount of calories from food to keep you going throughout the day. When you eat more calories than  your body needs, your body stores the extra calories as fat. When you eat fewer calories than your body needs, your body burns fat to get the energy it needs. Calorie counting means keeping track of how many calories you eat and drink each day. Calorie counting can be helpful if you need to lose weight. If you make sure to eat fewer calories than your body needs, you should lose weight. Ask your health care provider what a healthy weight is for you. For calorie counting to work, you will need to eat the right number of calories in a day in order to lose a healthy amount of weight per week. A dietitian can help you determine how many calories you need in a day and will give you suggestions on how to reach your calorie goal.  A healthy amount of weight to lose per week is usually 1-2 lb (0.5-0.9 kg). This usually means that your daily calorie intake should be reduced by  500-750 calories.  Eating 1,200 - 1,500 calories per day can help most women lose weight.  Eating 1,500 - 1,800 calories per day can help most men lose weight. What is my plan? My goal is to have __________ calories per day. If I have this many calories per day, I should lose around __________ pounds per week. What do I need to know about calorie counting? In order to meet your daily calorie goal, you will need to:  Find out how many calories are in each food you would like to eat. Try to do this before you eat.  Decide how much of the food you plan to eat.  Write down what you ate and how many calories it had. Doing this is called keeping a food log. To successfully lose weight, it is important to balance calorie counting with a healthy lifestyle that includes regular activity. Aim for 150 minutes of moderate exercise (such as walking) or 75 minutes of vigorous exercise (such as running) each week. Where do I find calorie information?  The number of calories in a food can be found on a Nutrition Facts label. If a food does not  have a Nutrition Facts label, try to look up the calories online or ask your dietitian for help. Remember that calories are listed per serving. If you choose to have more than one serving of a food, you will have to multiply the calories per serving by the amount of servings you plan to eat. For example, the label on a package of bread might say that a serving size is 1 slice and that there are 90 calories in a serving. If you eat 1 slice, you will have eaten 90 calories. If you eat 2 slices, you will have eaten 180 calories. How do I keep a food log? Immediately after each meal, record the following information in your food log:  What you ate. Don't forget to include toppings, sauces, and other extras on the food.  How much you ate. This can be measured in cups, ounces, or number of items.  How many calories each food and drink had.  The total number of calories in the meal. Keep your food log near you, such as in a small notebook in your pocket, or use a mobile app or website. Some programs will calculate calories for you and show you how many calories you have left for the day to meet your goal. What are some calorie counting tips?   Use your calories on foods and drinks that will fill you up and not leave you hungry: ? Some examples of foods that fill you up are nuts and nut butters, vegetables, lean proteins, and high-fiber foods like whole grains. High-fiber foods are foods with more than 5 g fiber per serving. ? Drinks such as sodas, specialty coffee drinks, alcohol, and juices have a lot of calories, yet do not fill you up.  Eat nutritious foods and avoid empty calories. Empty calories are calories you get from foods or beverages that do not have many vitamins or protein, such as candy, sweets, and soda. It is better to have a nutritious high-calorie food (such as an avocado) than a food with few nutrients (such as a bag of chips).  Know how many calories are in the foods you eat most  often. This will help you calculate calorie counts faster.  Pay attention to calories in drinks. Low-calorie drinks include water and unsweetened drinks.  Pay attention to nutrition labels  for "low fat" or "fat free" foods. These foods sometimes have the same amount of calories or more calories than the full fat versions. They also often have added sugar, starch, or salt, to make up for flavor that was removed with the fat.  Find a way of tracking calories that works for you. Get creative. Try different apps or programs if writing down calories does not work for you. What are some portion control tips?  Know how many calories are in a serving. This will help you know how many servings of a certain food you can have.  Use a measuring cup to measure serving sizes. You could also try weighing out portions on a kitchen scale. With time, you will be able to estimate serving sizes for some foods.  Take some time to put servings of different foods on your favorite plates, bowls, and cups so you know what a serving looks like.  Try not to eat straight from a bag or box. Doing this can lead to overeating. Put the amount you would like to eat in a cup or on a plate to make sure you are eating the right portion.  Use smaller plates, glasses, and bowls to prevent overeating.  Try not to multitask (for example, watch TV or use your computer) while eating. If it is time to eat, sit down at a table and enjoy your food. This will help you to know when you are full. It will also help you to be aware of what you are eating and how much you are eating. What are tips for following this plan? Reading food labels  Check the calorie count compared to the serving size. The serving size may be smaller than what you are used to eating.  Check the source of the calories. Make sure the food you are eating is high in vitamins and protein and low in saturated and trans fats. Shopping  Read nutrition labels while you  shop. This will help you make healthy decisions before you decide to purchase your food.  Make a grocery list and stick to it. Cooking  Try to cook your favorite foods in a healthier way. For example, try baking instead of frying.  Use low-fat dairy products. Meal planning  Use more fruits and vegetables. Half of your plate should be fruits and vegetables.  Include lean proteins like poultry and fish. How do I count calories when eating out?  Ask for smaller portion sizes.  Consider sharing an entree and sides instead of getting your own entree.  If you get your own entree, eat only half. Ask for a box at the beginning of your meal and put the rest of your entree in it so you are not tempted to eat it.  If calories are listed on the menu, choose the lower calorie options.  Choose dishes that include vegetables, fruits, whole grains, low-fat dairy products, and lean protein.  Choose items that are boiled, broiled, grilled, or steamed. Stay away from items that are buttered, battered, fried, or served with cream sauce. Items labeled "crispy" are usually fried, unless stated otherwise.  Choose water, low-fat milk, unsweetened iced tea, or other drinks without added sugar. If you want an alcoholic beverage, choose a lower calorie option such as a glass of wine or light beer.  Ask for dressings, sauces, and syrups on the side. These are usually high in calories, so you should limit the amount you eat.  If you want a salad, choose  a garden salad and ask for grilled meats. Avoid extra toppings like bacon, cheese, or fried items. Ask for the dressing on the side, or ask for olive oil and vinegar or lemon to use as dressing.  Estimate how many servings of a food you are given. For example, a serving of cooked rice is  cup or about the size of half a baseball. Knowing serving sizes will help you be aware of how much food you are eating at restaurants. The list below tells you how big or small  some common portion sizes are based on everyday objects: ? 1 oz--4 stacked dice. ? 3 oz--1 deck of cards. ? 1 tsp--1 die. ? 1 Tbsp-- a ping-pong ball. ? 2 Tbsp--1 ping-pong ball. ?  cup-- baseball. ? 1 cup--1 baseball. Summary  Calorie counting means keeping track of how many calories you eat and drink each day. If you eat fewer calories than your body needs, you should lose weight.  A healthy amount of weight to lose per week is usually 1-2 lb (0.5-0.9 kg). This usually means reducing your daily calorie intake by 500-750 calories.  The number of calories in a food can be found on a Nutrition Facts label. If a food does not have a Nutrition Facts label, try to look up the calories online or ask your dietitian for help.  Use your calories on foods and drinks that will fill you up, and not on foods and drinks that will leave you hungry.  Use smaller plates, glasses, and bowls to prevent overeating. This information is not intended to replace advice given to you by your health care provider. Make sure you discuss any questions you have with your health care provider. Document Revised: 02/13/2018 Document Reviewed: 04/26/2016 Elsevier Patient Education  2020 [employer].

## 2019-10-25 NOTE — Assessment & Plan Note (Addendum)
Hypertension not well controlled, provided education on diet and exercise modification, reducing salt intake, taking medication as prescribed, changed dose for hydrochlorothiazide from 12.5 mg to 25 mg daily. Educated patient to keep a blood pressure log for a week, call in results, and follow up in two weeks.   Rx sent to pharmacy, labs done, results pending

## 2019-10-26 LAB — CMP14+EGFR
ALT: 40 IU/L (ref 0–44)
AST: 32 IU/L (ref 0–40)
Albumin/Globulin Ratio: 1.9 (ref 1.2–2.2)
Albumin: 4.6 g/dL (ref 3.8–4.8)
Alkaline Phosphatase: 118 IU/L (ref 48–121)
BUN/Creatinine Ratio: 16 (ref 10–24)
BUN: 16 mg/dL (ref 8–27)
Bilirubin Total: 0.3 mg/dL (ref 0.0–1.2)
CO2: 24 mmol/L (ref 20–29)
Calcium: 9.5 mg/dL (ref 8.6–10.2)
Chloride: 105 mmol/L (ref 96–106)
Creatinine, Ser: 1.02 mg/dL (ref 0.76–1.27)
GFR calc Af Amer: 89 mL/min/{1.73_m2} (ref >59)
GFR calc non Af Amer: 77 mL/min/{1.73_m2} (ref >59)
Globulin, Total: 2.4 g/dL (ref 1.5–4.5)
Glucose: 91 mg/dL (ref 65–99)
Potassium: 4.3 mmol/L (ref 3.5–5.2)
Sodium: 142 mmol/L (ref 134–144)
Total Protein: 7 g/dL (ref 6.0–8.5)

## 2019-10-26 LAB — CBC WITH DIFFERENTIAL/PLATELET
Basophils Absolute: 0.1 10*3/uL (ref 0.0–0.2)
Basos: 1 %
EOS (ABSOLUTE): 0.2 10*3/uL (ref 0.0–0.4)
Eos: 3 %
Hematocrit: 43.6 % (ref 37.5–51.0)
Hemoglobin: 14.3 g/dL (ref 13.0–17.7)
Immature Grans (Abs): 0 10*3/uL (ref 0.0–0.1)
Immature Granulocytes: 0 %
Lymphocytes Absolute: 1.5 10*3/uL (ref 0.7–3.1)
Lymphs: 22 %
MCH: 29 pg (ref 26.6–33.0)
MCHC: 32.8 g/dL (ref 31.5–35.7)
MCV: 88 fL (ref 79–97)
Monocytes Absolute: 0.6 10*3/uL (ref 0.1–0.9)
Monocytes: 8 %
Neutrophils Absolute: 4.7 10*3/uL (ref 1.4–7.0)
Neutrophils: 66 %
Platelets: 201 10*3/uL (ref 150–450)
RBC: 4.93 x10E6/uL (ref 4.14–5.80)
RDW: 12.5 % (ref 11.6–15.4)
WBC: 7.1 10*3/uL (ref 3.4–10.8)

## 2019-10-26 LAB — LIPID PANEL
Chol/HDL Ratio: 3.5 ratio (ref 0.0–5.0)
Cholesterol, Total: 130 mg/dL (ref 100–199)
HDL: 37 mg/dL — ABNORMAL LOW (ref >39)
LDL Chol Calc (NIH): 68 mg/dL (ref 0–99)
Triglycerides: 141 mg/dL (ref 0–149)
VLDL Cholesterol Cal: 25 mg/dL (ref 5–40)

## 2019-11-02 ENCOUNTER — Other Ambulatory Visit: Payer: Self-pay | Admitting: *Deleted

## 2019-11-02 DIAGNOSIS — I10 Essential (primary) hypertension: Secondary | ICD-10-CM

## 2019-11-02 MED ORDER — LISINOPRIL 40 MG PO TABS: 40.0000 mg | ORAL_TABLET | Freq: Every day | ORAL | 1 refills | Status: DC

## 2019-11-12 ENCOUNTER — Other Ambulatory Visit: Payer: Self-pay

## 2019-11-12 ENCOUNTER — Ambulatory Visit: Payer: PRIVATE HEALTH INSURANCE | Admitting: Nurse Practitioner

## 2019-11-12 ENCOUNTER — Encounter: Payer: Self-pay | Admitting: Nurse Practitioner

## 2019-11-12 DIAGNOSIS — I10 Essential (primary) hypertension: Secondary | ICD-10-CM | POA: Diagnosis not present

## 2019-11-12 NOTE — Patient Instructions (Addendum)
Essential (primary) hypertension Patients Hypertension is well controlled on current medication no changes to medication dose. Patient will continue to monitor blood pressures at home, work on eating a low sodium diet and exercises. Patient will follow up as needed with worsening or uncontrolled symptoms.     Hypertension, Adult Hypertension is another name for high blood pressure. High blood pressure forces your heart to work harder to pump blood. This can cause problems over time. There are two numbers in a blood pressure reading. There is a top number (systolic) over a bottom number (diastolic). It is best to have a blood pressure that is below 120/80. Healthy choices can help lower your blood pressure, or you may need medicine to help lower it. What are the causes? The cause of this condition is not known. Some conditions may be related to high blood pressure. What increases the risk?  Smoking.  Having type 2 diabetes mellitus, high cholesterol, or both.  Not getting enough exercise or physical activity.  Being overweight.  Having too much fat, sugar, calories, or salt (sodium) in your diet.  Drinking too much alcohol.  Having long-term (chronic) kidney disease.  Having a family history of high blood pressure.  Age. Risk increases with age.  Race. You may be at higher risk if you are African American.  Gender. Men are at higher risk than women before age 45. After age 68, women are at higher risk than men.  Having obstructive sleep apnea.  Stress. What are the signs or symptoms?  High blood pressure may not cause symptoms. Very high blood pressure (hypertensive crisis) may cause: ? Headache. ? Feelings of worry or nervousness (anxiety). ? Shortness of breath. ? Nosebleed. ? A feeling of being sick to your stomach (nausea). ? Throwing up (vomiting). ? Changes in how you see. ? Very bad chest pain. ? Seizures. How is this treated?  This condition is treated by  making healthy lifestyle changes, such as: ? Eating healthy foods. ? Exercising more. ? Drinking less alcohol.  Your health care provider may prescribe medicine if lifestyle changes are not enough to get your blood pressure under control, and if: ? Your top number is above 130. ? Your bottom number is above 80.  Your personal target blood pressure may vary. Follow these instructions at home: Eating and drinking   If told, follow the DASH eating plan. To follow this plan: ? Fill one half of your plate at each meal with fruits and vegetables. ? Fill one fourth of your plate at each meal with whole grains. Whole grains include whole-wheat pasta, brown rice, and whole-grain bread. ? Eat or drink low-fat dairy products, such as skim milk or low-fat yogurt. ? Fill one fourth of your plate at each meal with low-fat (lean) proteins. Low-fat proteins include fish, chicken without skin, eggs, beans, and tofu. ? Avoid fatty meat, cured and processed meat, or chicken with skin. ? Avoid pre-made or processed food.  Eat less than 1,500 mg of salt each day.  Do not drink alcohol if: ? Your doctor tells you not to drink. ? You are pregnant, may be pregnant, or are planning to become pregnant.  If you drink alcohol: ? Limit how much you use to:  0-1 drink a day for women.  0-2 drinks a day for men. ? Be aware of how much alcohol is in your drink. In the [place] one drink equals one 12 oz bottle of beer (355 mL), one 5 oz glass of  wine (148 mL), or one 1 oz glass of hard liquor (44 mL). Lifestyle   Work with your doctor to stay at a healthy weight or to lose weight. Ask your doctor what the best weight is for you.  Get at least 30 minutes of exercise most days of the week. This may include walking, swimming, or biking.  Get at least 30 minutes of exercise that strengthens your muscles (resistance exercise) at least 3 days a week. This may include lifting weights or doing Pilates.  Do not use  any products that contain nicotine or tobacco, such as cigarettes, e-cigarettes, and chewing tobacco. If you need help quitting, ask your doctor.  Check your blood pressure at home as told by your doctor.  Keep all follow-up visits as told by your doctor. This is important. Medicines  Take over-the-counter and prescription medicines only as told by your doctor. Follow directions carefully.  Do not skip doses of blood pressure medicine. The medicine does not work as well if you skip doses. Skipping doses also puts you at risk for problems.  Ask your doctor about side effects or reactions to medicines that you should watch for. Contact a doctor if you:  Think you are having a reaction to the medicine you are taking.  Have headaches that keep coming back (recurring).  Feel dizzy.  Have swelling in your ankles.  Have trouble with your vision. Get help right away if you:  Get a very bad headache.  Start to feel mixed up (confused).  Feel weak or numb.  Feel faint.  Have very bad pain in your: ? Chest. ? Belly (abdomen).  Throw up more than once.  Have trouble breathing. Summary  Hypertension is another name for high blood pressure.  High blood pressure forces your heart to work harder to pump blood.  For most people, a normal blood pressure is less than 120/80.  Making healthy choices can help lower blood pressure. If your blood pressure does not get lower with healthy choices, you may need to take medicine. This information is not intended to replace advice given to you by your health care provider. Make sure you discuss any questions you have with your health care provider. Document Revised: 02/04/2018 Document Reviewed: 02/04/2018 Elsevier Patient Education  2020 [employer].

## 2019-11-12 NOTE — Assessment & Plan Note (Signed)
Patients Hypertension is well controlled on current medication no changes to medication dose. Patient will continue to monitor blood pressures at home, work on eating a low sodium diet and exercises. Patient will follow up as needed with worsening or uncontrolled symptoms.

## 2019-11-12 NOTE — Progress Notes (Signed)
Established Patient Office Visit  Subjective:  Patient ID: Adam Reeves, male    DOB: 1955-08-25  Age: 64 y.o. MRN: [id number]  CC:  Chief Complaint  Patient presents with  . Hypertension    2 week follow up     HPI Adam Reeves  presents for follow up of hypertension. Patient was diagnosed in 2020. The patient is tolerating the medication well without side effects. Compliance with treatment has been good; including taking medication as directed , maintains a healthy diet and regular exercise regimen , and following up as directed.  Past Medical History:  Diagnosis Date  . Hypertension     Past Surgical History:  Procedure Laterality Date  . BACK SURGERY    . HERNIA REPAIR        Social History   Socioeconomic History  . Marital status: Divorced    Spouse name: Not on file  . Number of children: 4  . Years of education: Not on file  . Highest education level: Not on file  Occupational History  . Not on file  Tobacco Use  . Smoking status: Never Smoker  . Smokeless tobacco: Never Used  Substance and Sexual Activity  . Alcohol use: Yes    Comment: occasional  . Drug use: No  . Sexual activity: Yes  Other Topics Concern  . Not on file  Social History Narrative  . Not on file   Social Determinants of Health   Financial Resource Strain:   . Difficulty of Paying Living Expenses:   Food Insecurity:   . Worried About Charity fundraiser in the Last Year:   . Arboriculturist in the Last Year:   Transportation Needs:   . Film/video editor (Medical):   Marland Kitchen Lack of Transportation (Non-Medical):   Physical Activity:   . Days of Exercise per Week:   . Minutes of Exercise per Session:   Stress:   . Feeling of Stress :   Social Connections:   . Frequency of Communication with Friends and Family:   . Frequency of Social Gatherings with Friends and Family:   . Attends Religious Services:   . Active Member of Clubs or Organizations:   . Attends Theatre manager Meetings:   Marland Kitchen Marital Status:   Intimate Partner Violence:   . Fear of Current or Ex-Partner:   . Emotionally Abused:   Marland Kitchen Physically Abused:   . Sexually Abused:     Outpatient Medications Prior to Visit  Medication Sig Dispense Refill  . atorvastatin (LIPITOR) 40 MG tablet Take 1 tablet (40 mg total) by mouth daily. 90 tablet 1  . bethanechol (URECHOLINE) 25 MG tablet Take 1 tablet (25 mg total) by mouth in the morning and at bedtime. 30 tablet 3  . finasteride (PROSCAR) 5 MG tablet Take 1 tablet (5 mg total) by mouth daily. 90 tablet 1  . hydrochlorothiazide (HYDRODIURIL) 25 MG tablet Take 1 tablet (25 mg total) by mouth daily. 90 tablet 1  . lisinopril (ZESTRIL) 40 MG tablet Take 1 tablet (40 mg total) by mouth daily. 90 tablet 1  . silodosin (RAPAFLO) 8 MG CAPS capsule Take 1 capsule (8 mg total) by mouth daily with breakfast. 30 capsule 3   No facility-administered medications prior to visit.      ROS Review of Systems  Constitutional: Negative.   HENT: Negative.   Eyes: Negative.   Respiratory: Negative.   Endocrine: Negative.   Genitourinary: Negative.  Musculoskeletal: Negative.   Skin: Negative for pallor and rash.  Neurological: Negative for light-headedness and headaches.  Psychiatric/Behavioral: Negative.       Objective:    Physical Exam  Constitutional: He is oriented to person, place, and time. He appears well-developed and well-nourished.  Eyes: Conjunctivae are normal.  Cardiovascular: Normal rate and regular rhythm.  Pulmonary/Chest: Breath sounds normal.  Abdominal: Bowel sounds are normal.  Musculoskeletal:     Cervical back: Neck supple.  Neurological: He is alert and oriented to person, place, and time.  Skin: No rash noted. No erythema.  Psychiatric: He has a normal mood and affect. His behavior is normal. Thought content normal.    BP 122/65   Pulse 81   Temp 97.8 F (36.6 C)   Resp 20   Ht 6\' 8"  (2.032 m)   Wt 240 lb  (108.9 kg)   SpO2 98%   BMI 26.37 kg/m  Wt Readings from Last 3 Encounters:  10/25/19 243 lb (110.2 kg)  08/28/18 243 lb (110.2 kg)  09/15/17 238 lb 3.2 oz (108 kg)       Lab Results  Component Value Date   TSH 2.130 08/28/2018   Lab Results  Component Value Date   WBC 7.1 10/25/2019   HGB 14.3 10/25/2019   HCT 43.6 10/25/2019   MCV 88 10/25/2019   PLT 201 10/25/2019   Lab Results  Component Value Date   NA 142 10/25/2019   K 4.3 10/25/2019   CO2 24 10/25/2019   GLUCOSE 91 10/25/2019   BUN 16 10/25/2019   CREATININE 1.02 10/25/2019   BILITOT 0.3 10/25/2019   ALKPHOS 118 10/25/2019   AST 32 10/25/2019   ALT 40 10/25/2019   PROT 7.0 10/25/2019   ALBUMIN 4.6 10/25/2019   CALCIUM 9.5 10/25/2019   Lab Results  Component Value Date   CHOL 130 10/25/2019   Lab Results  Component Value Date   HDL 37 (L) 10/25/2019   Lab Results  Component Value Date   LDLCALC 68 10/25/2019   Lab Results  Component Value Date   TRIG 141 10/25/2019   Lab Results  Component Value Date   CHOLHDL 3.5 10/25/2019      Assessment & Plan:   Problem List Items Addressed This Visit      Cardiovascular and Mediastinum   Essential (primary) hypertension    Patients Hypertension is well controlled on current medication no changes to medication dose. Patient will continue to monitor blood pressures at home, work on eating a low sodium diet and exercises. Patient will follow up as needed with worsening or uncontrolled symptoms.            Follow-up: No follow-ups on file.    10/27/2019, NP

## 2020-05-15 ENCOUNTER — Ambulatory Visit: Payer: PRIVATE HEALTH INSURANCE | Admitting: Family

## 2020-06-16 ENCOUNTER — Ambulatory Visit: Payer: PRIVATE HEALTH INSURANCE | Admitting: Family

## 2020-06-16 ENCOUNTER — Encounter: Payer: Self-pay | Admitting: Family

## 2020-06-16 ENCOUNTER — Other Ambulatory Visit: Payer: Self-pay

## 2020-06-16 VITALS — BP 163/82 | HR 68 | Temp 98.1°F | Ht >= 80 in | Wt 240.4 lb

## 2020-06-16 DIAGNOSIS — N400 Enlarged prostate without lower urinary tract symptoms: Secondary | ICD-10-CM

## 2020-06-16 DIAGNOSIS — I10 Essential (primary) hypertension: Secondary | ICD-10-CM | POA: Diagnosis not present

## 2020-06-16 DIAGNOSIS — Z114 Encounter for screening for human immunodeficiency virus [HIV]: Secondary | ICD-10-CM

## 2020-06-16 DIAGNOSIS — Z1211 Encounter for screening for malignant neoplasm of colon: Secondary | ICD-10-CM | POA: Diagnosis not present

## 2020-06-16 DIAGNOSIS — Z0001 Encounter for general adult medical examination with abnormal findings: Secondary | ICD-10-CM | POA: Diagnosis not present

## 2020-06-16 DIAGNOSIS — Z6826 Body mass index (BMI) 26.0-26.9, adult: Secondary | ICD-10-CM

## 2020-06-16 DIAGNOSIS — Z0000 Encounter for general adult medical examination without abnormal findings: Secondary | ICD-10-CM

## 2020-06-16 DIAGNOSIS — E782 Mixed hyperlipidemia: Secondary | ICD-10-CM | POA: Diagnosis not present

## 2020-06-16 DIAGNOSIS — R972 Elevated prostate specific antigen [PSA]: Secondary | ICD-10-CM

## 2020-06-16 MED ORDER — BETHANECHOL CHLORIDE 25 MG PO TABS: 25.0000 mg | ORAL_TABLET | Freq: Two times a day (BID) | ORAL | 3 refills | Status: DC

## 2020-06-16 MED ORDER — LISINOPRIL 40 MG PO TABS: 40.0000 mg | ORAL_TABLET | Freq: Every day | ORAL | 1 refills | Status: DC

## 2020-06-16 MED ORDER — FINASTERIDE 5 MG PO TABS: 5.0000 mg | ORAL_TABLET | Freq: Every day | ORAL | 1 refills | Status: DC

## 2020-06-16 MED ORDER — HYDROCHLOROTHIAZIDE 25 MG PO TABS: 25.0000 mg | ORAL_TABLET | Freq: Every day | ORAL | 1 refills | Status: DC

## 2020-06-16 MED ORDER — SILODOSIN 8 MG PO CAPS
8.0000 mg | ORAL_CAPSULE | Freq: Every day | ORAL | 3 refills | Status: DC
Start: 2020-06-16 — End: 2020-12-29

## 2020-06-16 MED ORDER — ATORVASTATIN CALCIUM 40 MG PO TABS
40.0000 mg | ORAL_TABLET | Freq: Every day | ORAL | 1 refills | Status: DC
Start: 2020-06-16 — End: 2020-12-29

## 2020-06-16 NOTE — Patient Instructions (Signed)

## 2020-06-16 NOTE — Progress Notes (Signed)
Subjective:    Patient ID: Adam Reeves, male    DOB: 06/04/1956, 65 y.o.   MRN: [id number]  Chief Complaint  Patient presents with  . Pain Management    Rakes patient then started seeing Je now you. Med refill.    Pt presents to the office today for CPE and chronic follow up. He states he has been out of his medication for the last couple weeks. 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  Hypertension This is a chronic problem. The current episode started more than 1 year ago. The problem is uncontrolled. Pertinent negatives include no malaise/fatigue, peripheral edema or shortness of breath. Risk factors for coronary artery disease include dyslipidemia, obesity, male gender and sedentary lifestyle. Treatments tried: has been out ogf medication  There is no history of CVA or heart failure.  Hyperlipidemia This is a chronic problem. The current episode started more than 1 year ago. Pertinent negatives include no shortness of breath. Current antihyperlipidemic treatment includes statins. The current treatment provides moderate improvement of lipids. Risk factors for coronary artery disease include dyslipidemia, male sex, hypertension and a sedentary lifestyle.  Benign Prostatic Hypertrophy This is a chronic problem. The current episode started more than 1 year ago. Irritative symptoms include nocturia and urgency.      Review of Systems  Constitutional: Negative for malaise/fatigue.  Respiratory: Negative for shortness of breath.   Genitourinary: Positive for nocturia and urgency.  All other systems reviewed and are negative.      Objective:   Physical Exam Vitals reviewed.  Constitutional:      General: He is not in acute distress.    Appearance: He is well-developed and well-nourished.  HENT:     Head: Normocephalic.     Right Ear: Tympanic membrane normal.     Left Ear: Tympanic membrane normal.     Mouth/Throat:     Mouth:  Oropharynx is clear and moist.  Eyes:     General:        Right eye: No discharge.        Left eye: No discharge.     Pupils: Pupils are equal, round, and reactive to light.  Neck:     Thyroid: No thyromegaly.  Cardiovascular:     Rate and Rhythm: Normal rate and regular rhythm.     Pulses: Intact distal pulses.     Heart sounds: Normal heart sounds. No murmur heard.   Pulmonary:     Effort: Pulmonary effort is normal. No respiratory distress.     Breath sounds: Normal breath sounds. No wheezing.  Abdominal:     General: Bowel sounds are normal. There is no distension.     Palpations: Abdomen is soft.     Tenderness: There is no abdominal tenderness.  Musculoskeletal:        General: No tenderness or edema. Normal range of motion.     Cervical back: Normal range of motion and neck supple.  Skin:    General: Skin is warm and dry.     Findings: No erythema or rash.  Neurological:     Mental Status: He is alert and oriented to person, place, and time.     Cranial Nerves: No cranial nerve deficit.     Deep Tendon Reflexes: Reflexes are normal and symmetric.  Psychiatric:        Mood and Affect: Mood and affect normal.  Behavior: Behavior normal.        Thought Content: Thought content normal.        Judgment: Judgment normal.       BP (!) 163/82   Pulse 68   Temp 98.1 F (36.7 C)   Ht 6' 8" (2.032 m)   Wt 240 lb 6.4 oz (109 kg)   BMI 26.41 kg/m      Assessment & Plan:  Adam Reeves comes in today with chief complaint of Pain Management (Rakes patient then started seeing Je now you. Med refill. )   Diagnosis and orders addressed:  1. Essential (primary) hypertension I have refilled all of his medications today. We will recheck in 2 weeks. If still elevated will need to change medications.  - lisinopril (ZESTRIL) 40 MG tablet; Take 1 tablet (40 mg total) by mouth daily.  Dispense: 90 tablet; Refill: 1 - hydrochlorothiazide (HYDRODIURIL) 25 MG tablet; Take 1  tablet (25 mg total) by mouth daily.  Dispense: 90 tablet; Refill: 1 - CMP14+EGFR - CBC with Differential/Platelet  2. Mixed hyperlipidemia - atorvastatin (LIPITOR) 40 MG tablet; Take 1 tablet (40 mg total) by mouth daily.  Dispense: 90 tablet; Refill: 1 - CMP14+EGFR - CBC with Differential/Platelet - Lipid panel  3. Annual physical exam - Cologuard - CMP14+EGFR - CBC with Differential/Platelet - Lipid panel - TSH - HIV Antibody (routine testing w rflx)  4. Colon cancer screening - Cologuard - CMP14+EGFR - CBC with Differential/Platelet  5. BPH with elevated PSA - silodosin (RAPAFLO) 8 MG CAPS capsule; Take 1 capsule (8 mg total) by mouth daily with breakfast.  Dispense: 30 capsule; Refill: 3 - finasteride (PROSCAR) 5 MG tablet; Take 1 tablet (5 mg total) by mouth daily.  Dispense: 90 tablet; Refill: 1 - bethanechol (URECHOLINE) 25 MG tablet; Take 1 tablet (25 mg total) by mouth in the morning and at bedtime.  Dispense: 30 tablet; Refill: 3 - CMP14+EGFR - CBC with Differential/Platelet  6. BMI 26.0-26.9,adult - CMP14+EGFR - CBC with Differential/Platelet  7. Encounter for screening for HIV - CMP14+EGFR - CBC with Differential/Platelet - HIV Antibody (routine testing w rflx)   Labs pending Health Maintenance reviewed- Refuses all vaccines today.  Diet and exercise encouraged  Follow up plan: 2 weeks to recheck HTN   Christy Hawks, FNP  

## 2020-06-17 LAB — CMP14+EGFR
ALT: 23 IU/L (ref 0–44)
AST: 19 IU/L (ref 0–40)
Albumin/Globulin Ratio: 1.8 (ref 1.2–2.2)
Albumin: 4.4 g/dL (ref 3.8–4.8)
Alkaline Phosphatase: 97 IU/L (ref 44–121)
BUN/Creatinine Ratio: 11 (ref 10–24)
BUN: 11 mg/dL (ref 8–27)
Bilirubin Total: 0.3 mg/dL (ref 0.0–1.2)
CO2: 21 mmol/L (ref 20–29)
Calcium: 9.4 mg/dL (ref 8.6–10.2)
Chloride: 104 mmol/L (ref 96–106)
Creatinine, Ser: 1.03 mg/dL (ref 0.76–1.27)
GFR calc Af Amer: 88 mL/min/{1.73_m2} (ref >59)
GFR calc non Af Amer: 76 mL/min/{1.73_m2} (ref >59)
Globulin, Total: 2.4 g/dL (ref 1.5–4.5)
Glucose: 90 mg/dL (ref 65–99)
Potassium: 4.3 mmol/L (ref 3.5–5.2)
Sodium: 139 mmol/L (ref 134–144)
Total Protein: 6.8 g/dL (ref 6.0–8.5)

## 2020-06-17 LAB — LIPID PANEL
Chol/HDL Ratio: 3.6 ratio (ref 0.0–5.0)
Cholesterol, Total: 135 mg/dL (ref 100–199)
HDL: 37 mg/dL — ABNORMAL LOW (ref >39)
LDL Chol Calc (NIH): 80 mg/dL (ref 0–99)
Triglycerides: 92 mg/dL (ref 0–149)
VLDL Cholesterol Cal: 18 mg/dL (ref 5–40)

## 2020-06-17 LAB — CBC WITH DIFFERENTIAL/PLATELET
Basophils Absolute: 0 10*3/uL (ref 0.0–0.2)
Basos: 1 %
EOS (ABSOLUTE): 0.2 10*3/uL (ref 0.0–0.4)
Eos: 3 %
Hematocrit: 41 % (ref 37.5–51.0)
Hemoglobin: 14 g/dL (ref 13.0–17.7)
Immature Grans (Abs): 0 10*3/uL (ref 0.0–0.1)
Immature Granulocytes: 0 %
Lymphocytes Absolute: 1.5 10*3/uL (ref 0.7–3.1)
Lymphs: 30 %
MCH: 29.4 pg (ref 26.6–33.0)
MCHC: 34.1 g/dL (ref 31.5–35.7)
MCV: 86 fL (ref 79–97)
Monocytes Absolute: 0.5 10*3/uL (ref 0.1–0.9)
Monocytes: 10 %
Neutrophils Absolute: 2.8 10*3/uL (ref 1.4–7.0)
Neutrophils: 56 %
Platelets: 200 10*3/uL (ref 150–450)
RBC: 4.77 x10E6/uL (ref 4.14–5.80)
RDW: 13 % (ref 11.6–15.4)
WBC: 5.1 10*3/uL (ref 3.4–10.8)

## 2020-06-17 LAB — HIV ANTIBODY (ROUTINE TESTING W REFLEX): HIV Screen 4th Generation wRfx: NONREACTIVE

## 2020-06-17 LAB — TSH: TSH: 1.97 u[IU]/mL (ref 0.450–4.500)

## 2020-06-20 ENCOUNTER — Other Ambulatory Visit: Payer: Self-pay | Admitting: Family

## 2020-06-30 ENCOUNTER — Encounter: Payer: Self-pay | Admitting: Family

## 2020-06-30 ENCOUNTER — Other Ambulatory Visit: Payer: Self-pay

## 2020-06-30 ENCOUNTER — Ambulatory Visit: Payer: PRIVATE HEALTH INSURANCE | Admitting: Family

## 2020-06-30 VITALS — BP 121/78 | HR 91 | Temp 98.0°F | Ht >= 80 in | Wt 238.4 lb

## 2020-06-30 DIAGNOSIS — I10 Essential (primary) hypertension: Secondary | ICD-10-CM

## 2020-06-30 NOTE — Progress Notes (Signed)
Subjective:    Patient ID: Adam Reeves, male    DOB: 12/22/1955, 65 y.o.   MRN: [id number]  Chief Complaint  Patient presents with  . Blood Pressure Check   Pt presents to the office today to recheck HTN. Pt was seen on 06/16/20 and had been out of his medications. We refilled his lisinopril 40 mg and HCTZ 25 mg. His BP is at goal today! Hypertension This is a chronic problem. The current episode started more than 1 year ago. The problem has been resolved since onset. Pertinent negatives include no malaise/fatigue, peripheral edema or shortness of breath. Risk factors for coronary artery disease include dyslipidemia and obesity. Past treatments include ACE inhibitors and diuretics. The current treatment provides moderate improvement.      Review of Systems  Constitutional: Negative for malaise/fatigue.  Respiratory: Negative for shortness of breath.   All other systems reviewed and are negative.      Objective:   Physical Exam Vitals reviewed.  Constitutional:      General: He is not in acute distress.    Appearance: He is well-developed and well-nourished.  HENT:     Head: Normocephalic.     Mouth/Throat:     Mouth: Oropharynx is clear and moist.  Eyes:     General:        Right eye: No discharge.        Left eye: No discharge.     Pupils: Pupils are equal, round, and reactive to light.  Neck:     Thyroid: No thyromegaly.  Cardiovascular:     Rate and Rhythm: Normal rate and regular rhythm.     Pulses: Intact distal pulses.     Heart sounds: Normal heart sounds. No murmur heard.   Pulmonary:     Effort: Pulmonary effort is normal. No respiratory distress.     Breath sounds: Normal breath sounds. No wheezing.  Abdominal:     General: Bowel sounds are normal. There is no distension.     Palpations: Abdomen is soft.     Tenderness: There is no abdominal tenderness.  Musculoskeletal:        General: No tenderness or edema. Normal range of motion.     Cervical  back: Normal range of motion and neck supple.  Skin:    General: Skin is warm and dry.     Findings: No erythema or rash.  Neurological:     Mental Status: He is alert and oriented to person, place, and time.     Cranial Nerves: No cranial nerve deficit.     Deep Tendon Reflexes: Reflexes are normal and symmetric.  Psychiatric:        Mood and Affect: Mood and affect normal.        Behavior: Behavior normal.        Thought Content: Thought content normal.        Judgment: Judgment normal.       BP 121/78   Pulse 91   Temp 98 F (36.7 C) (Temporal)   Ht _0  (2.032 m)   Wt 238 lb 6.4 oz (108.1 kg)   BMI 26.19 kg/m      Assessment & Plan:  Adam Reeves comes in today with chief complaint of Blood Pressure Check   Diagnosis and orders addressed:  1. Essential (primary) hypertension At goal today! -Dash diet information given -Exercise encouraged - Stress Management  -Continue current meds -RTO in 6 months  - BMP8+EGFR   Discussed  the importance of completing [place], FNP

## 2020-06-30 NOTE — Patient Instructions (Signed)

## 2020-07-01 LAB — BMP8+EGFR
BUN/Creatinine Ratio: 17 (ref 10–24)
BUN: 17 mg/dL (ref 8–27)
CO2: 24 mmol/L (ref 20–29)
Calcium: 10.1 mg/dL (ref 8.6–10.2)
Chloride: 101 mmol/L (ref 96–106)
Creatinine, Ser: 0.99 mg/dL (ref 0.76–1.27)
GFR calc Af Amer: 92 mL/min/{1.73_m2} (ref >59)
GFR calc non Af Amer: 80 mL/min/{1.73_m2} (ref >59)
Glucose: 110 mg/dL — ABNORMAL HIGH (ref 65–99)
Potassium: 4.6 mmol/L (ref 3.5–5.2)
Sodium: 139 mmol/L (ref 134–144)

## 2020-09-25 ENCOUNTER — Other Ambulatory Visit: Payer: Self-pay

## 2020-09-25 DIAGNOSIS — I10 Essential (primary) hypertension: Secondary | ICD-10-CM

## 2020-09-25 MED ORDER — HYDROCHLOROTHIAZIDE 25 MG PO TABS: 25.0000 mg | ORAL_TABLET | Freq: Every day | ORAL | 0 refills | Status: DC

## 2020-10-16 ENCOUNTER — Encounter: Payer: Self-pay | Admitting: Family Medicine

## 2020-10-18 LAB — COLOGUARD: Cologuard: NEGATIVE

## 2020-12-29 ENCOUNTER — Encounter: Payer: Self-pay | Admitting: Family

## 2020-12-29 ENCOUNTER — Other Ambulatory Visit: Payer: Self-pay

## 2020-12-29 ENCOUNTER — Ambulatory Visit: Payer: PRIVATE HEALTH INSURANCE | Admitting: Family

## 2020-12-29 VITALS — BP 133/80 | HR 72 | Temp 97.8°F | Ht >= 80 in | Wt 242.2 lb

## 2020-12-29 DIAGNOSIS — E782 Mixed hyperlipidemia: Secondary | ICD-10-CM

## 2020-12-29 DIAGNOSIS — E785 Hyperlipidemia, unspecified: Secondary | ICD-10-CM | POA: Diagnosis not present

## 2020-12-29 DIAGNOSIS — N400 Enlarged prostate without lower urinary tract symptoms: Secondary | ICD-10-CM

## 2020-12-29 DIAGNOSIS — I10 Essential (primary) hypertension: Secondary | ICD-10-CM | POA: Diagnosis not present

## 2020-12-29 DIAGNOSIS — R972 Elevated prostate specific antigen [PSA]: Secondary | ICD-10-CM

## 2020-12-29 LAB — CMP14+EGFR
ALT: 27 IU/L (ref 0–44)
AST: 28 IU/L (ref 0–40)
Albumin/Globulin Ratio: 1.9 (ref 1.2–2.2)
Albumin: 4.3 g/dL (ref 3.8–4.8)
Alkaline Phosphatase: 97 IU/L (ref 44–121)
BUN/Creatinine Ratio: 18 (ref 10–24)
BUN: 16 mg/dL (ref 8–27)
Bilirubin Total: 0.4 mg/dL (ref 0.0–1.2)
CO2: 23 mmol/L (ref 20–29)
Calcium: 9 mg/dL (ref 8.6–10.2)
Chloride: 105 mmol/L (ref 96–106)
Creatinine, Ser: 0.89 mg/dL (ref 0.76–1.27)
Globulin, Total: 2.3 g/dL (ref 1.5–4.5)
Glucose: 107 mg/dL — ABNORMAL HIGH (ref 65–99)
Potassium: 4.7 mmol/L (ref 3.5–5.2)
Sodium: 140 mmol/L (ref 134–144)
Total Protein: 6.6 g/dL (ref 6.0–8.5)
eGFR: 95 mL/min/{1.73_m2} (ref >59)

## 2020-12-29 MED ORDER — ATORVASTATIN CALCIUM 40 MG PO TABS: 40.0000 mg | ORAL_TABLET | Freq: Every day | ORAL | 1 refills | Status: DC

## 2020-12-29 MED ORDER — FINASTERIDE 5 MG PO TABS: 5.0000 mg | ORAL_TABLET | Freq: Every day | ORAL | 1 refills | Status: DC

## 2020-12-29 MED ORDER — HYDROCHLOROTHIAZIDE 25 MG PO TABS
25.0000 mg | ORAL_TABLET | Freq: Every day | ORAL | 0 refills | Status: DC
Start: 2020-12-29 — End: 2021-10-09

## 2020-12-29 MED ORDER — LISINOPRIL 40 MG PO TABS
40.0000 mg | ORAL_TABLET | Freq: Every day | ORAL | 1 refills | Status: DC
Start: 2020-12-29 — End: 2021-04-23

## 2020-12-29 MED ORDER — SILODOSIN 8 MG PO CAPS: 8.0000 mg | ORAL_CAPSULE | Freq: Every day | ORAL | 3 refills | Status: DC

## 2020-12-29 NOTE — Progress Notes (Signed)
Subjective:    Patient ID: Adam Reeves, male    DOB: [birth year], 65 y.o.   MRN: [id number]  Chief Complaint  Patient presents with   Medical Management of Chronic Issues   PT presents to the office today for chronic follow up.  Hypertension This is a chronic problem. The current episode started more than 1 year ago. The problem has been waxing and waning since onset. The problem is controlled. Pertinent negatives include no malaise/fatigue, peripheral edema or shortness of breath. Risk factors for coronary artery disease include dyslipidemia and male gender. The current treatment provides moderate improvement.  Benign Prostatic Hypertrophy This is a chronic problem. The current episode started more than 1 year ago. The problem has been waxing and waning since onset. Irritative symptoms include nocturia. Irritative symptoms do not include frequency. Past treatments include finasteride. The treatment provided mild relief.  Hyperlipidemia This is a chronic problem. The current episode started more than 1 year ago. Pertinent negatives include no shortness of breath. Current antihyperlipidemic treatment includes diet change. The current treatment provides moderate improvement of lipids. Risk factors for coronary artery disease include dyslipidemia, hypertension and a sedentary lifestyle.     Review of Systems  Constitutional:  Negative for malaise/fatigue.  Respiratory:  Negative for shortness of breath.   Genitourinary:  Positive for nocturia. Negative for frequency.  All other systems reviewed and are negative.     Objective:   Physical Exam Vitals reviewed.  Constitutional:      General: He is not in acute distress.    Appearance: He is well-developed.  HENT:     Head: Normocephalic.     Right Ear: Tympanic membrane normal.     Left Ear: Tympanic membrane normal.  Eyes:     General:        Right eye: No discharge.        Left eye: No discharge.     Pupils: Pupils are equal,  round, and reactive to light.  Neck:     Thyroid: No thyromegaly.  Cardiovascular:     Rate and Rhythm: Normal rate and regular rhythm.     Heart sounds: Normal heart sounds. No murmur heard. Pulmonary:     Effort: Pulmonary effort is normal. No respiratory distress.     Breath sounds: Normal breath sounds. No wheezing.  Abdominal:     General: Bowel sounds are normal. There is no distension.     Palpations: Abdomen is soft.     Tenderness: There is no abdominal tenderness.  Musculoskeletal:        General: No tenderness. Normal range of motion.     Cervical back: Normal range of motion and neck supple.  Skin:    General: Skin is warm and dry.     Findings: No erythema or rash.  Neurological:     Mental Status: He is alert and oriented to person, place, and time.     Cranial Nerves: No cranial nerve deficit.     Deep Tendon Reflexes: Reflexes are normal and symmetric.  Psychiatric:        Behavior: Behavior normal.        Thought Content: Thought content normal.        Judgment: Judgment normal.      BP 133/80 Comment: patient reports at home  Pulse 72   Temp 97.8 F (36.6 C) (Temporal)   Ht 6\' 8"  (2.032 m)   Wt 242 lb 3.2 oz (109.9 kg)   SpO2 98%  BMI 26.61 kg/m      Assessment & Plan:  MICHOEL KUNIN comes in today with chief complaint of Medical Management of Chronic Issues   Diagnosis and orders addressed:  1. BPH with elevated PSA - finasteride (PROSCAR) 5 MG tablet; Take 1 tablet (5 mg total) by mouth daily.  Dispense: 90 tablet; Refill: 1 - silodosin (RAPAFLO) 8 MG CAPS capsule; Take 1 capsule (8 mg total) by mouth daily with breakfast.  Dispense: 30 capsule; Refill: 3 - CMP14+EGFR  2. Essential (primary) hypertension - hydrochlorothiazide (HYDRODIURIL) 25 MG tablet; Take 1 tablet (25 mg total) by mouth daily.  Dispense: 90 tablet; Refill: 0 - lisinopril (ZESTRIL) 40 MG tablet; Take 1 tablet (40 mg total) by mouth daily.  Dispense: 90 tablet; Refill: 1 -  CMP14+EGFR  3. Mixed hyperlipidemia - atorvastatin (LIPITOR) 40 MG tablet; Take 1 tablet (40 mg total) by mouth daily.  Dispense: 90 tablet; Refill: 1 - CMP14+EGFR  4. Hyperlipidemia, unspecified hyperlipidemia type - CMP14+EGFR   Labs pending Health Maintenance reviewed Diet and exercise encouraged  Follow up plan: 1 year    Evelina Dun, FNP

## 2020-12-29 NOTE — Patient Instructions (Signed)

## 2021-01-19 ENCOUNTER — Encounter: Payer: Self-pay | Admitting: *Deleted

## 2021-03-30 ENCOUNTER — Encounter: Payer: Self-pay | Admitting: Urology

## 2021-03-30 ENCOUNTER — Ambulatory Visit (INDEPENDENT_AMBULATORY_CARE_PROVIDER_SITE_OTHER): Payer: No Typology Code available for payment source | Admitting: Urology

## 2021-03-30 ENCOUNTER — Other Ambulatory Visit: Payer: Self-pay

## 2021-03-30 VITALS — BP 152/81 | HR 80 | Temp 98.0°F | Wt 237.2 lb

## 2021-03-30 DIAGNOSIS — R972 Elevated prostate specific antigen [PSA]: Secondary | ICD-10-CM

## 2021-03-30 DIAGNOSIS — R339 Retention of urine, unspecified: Secondary | ICD-10-CM | POA: Diagnosis not present

## 2021-03-30 DIAGNOSIS — N401 Enlarged prostate with lower urinary tract symptoms: Secondary | ICD-10-CM

## 2021-03-30 DIAGNOSIS — N3001 Acute cystitis with hematuria: Secondary | ICD-10-CM

## 2021-03-30 DIAGNOSIS — N138 Other obstructive and reflux uropathy: Secondary | ICD-10-CM

## 2021-03-30 MED ORDER — SILODOSIN 8 MG PO CAPS: 8.0000 mg | ORAL_CAPSULE | Freq: Every day | ORAL | 11 refills | Status: DC

## 2021-03-30 MED ORDER — SULFAMETHOXAZOLE-TRIMETHOPRIM 800-160 MG PO TABS: 1.0000 | ORAL_TABLET | Freq: Two times a day (BID) | ORAL | 0 refills | Status: AC

## 2021-03-30 MED ORDER — BETHANECHOL CHLORIDE 25 MG PO TABS: 25.0000 mg | ORAL_TABLET | Freq: Two times a day (BID) | ORAL | 11 refills | Status: DC

## 2021-03-30 NOTE — Progress Notes (Signed)
Fill and Pull Catheter Removal  Patient is present today for a catheter removal.  Patient was cleaned and prepped in a sterile fashion 24ml of sterile water/ saline was instilled into the bladder when the patient began to have bladder spasms. A 18FR coude foley cath was removed from the bladder per Dr. Pete Glatter. No complications were noted. Patient tolerated well.  Performed by: Brayden Betters LPN  Follow up/ Additional notes: Per MD note.

## 2021-03-30 NOTE — Progress Notes (Signed)

## 2021-03-30 NOTE — Progress Notes (Signed)
Assessment: 1. Urinary retention   2. BPH with obstruction/lower urinary tract symptoms   3. Elevated PSA   4. Acute cystitis with hematuria      Plan: I reviewed the patient's prior records from [facility] and [facility] ER. Foley catheter removed today. Patient instructed to contact the office if he is unable to void later today. He does have supplies for self-catheterization if needed. Continue silodosin, Urecholine, and finasteride.  Refills sent. Bactrim DS twice daily x 7 days. Return to office in 7-10 days for bladder scan and re-evaluation.  Chief Complaint:  Chief Complaint  Patient presents with   Urinary Retention     History of Present Illness:  Adam Reeves is a 65 y.o. year old male who is seen in consultation from Junie Spencer, FNP for evaluation of urinary retention.  He presented to [facility] ER on 03/26/2021 with urinary retention.  He also noted gross hematuria.  He reports running out of his silodosin and Urecholine for approximately 2 weeks.  He attempted self-catheterization with some difficulty prior to going to the emergency room.  A Foley catheter was placed with return of bloody urine.  Urinalysis showed >100 RBCs, few bacteria.  Urine culture grew >100 K colonies.  CT imaging showed evidence of renal or ureteral calculi, no hydronephrosis, decompressed bladder, and enlarged prostate. His Foley catheter has been draining well with clear urine.  No blood clots.  He has a history of BPH with obstruction and was previously followed by [facility] urology.  He has been managed medically with Scilla dosing, finasteride, and Urecholine.  He did have a prior episode of urinary retention.  He has a history of elevated PSA.  His PSA in 3/19 was 8.9 with 12% free.  He has wanted to be proceed with a prostate biopsy.   Past Medical History:  Past Medical History:  Diagnosis Date   Hypertension     Past Surgical History:  Past Surgical  History:  Procedure Laterality Date   BACK SURGERY     HERNIA REPAIR      Allergies:  No Known Allergies  Family History:  No family history on file.  Social History:  Social History   Tobacco Use   Smoking status: Never   Smokeless tobacco: Never  Vaping Use   Vaping Use: Never used  Substance Use Topics   Alcohol use: Yes    Comment: occasional   Drug use: No    Review of symptoms:  Constitutional:  Negative for unexplained weight loss, night sweats, fever, chills ENT:  Negative for nose bleeds, sinus pain, painful swallowing CV:  Negative for chest pain, shortness of breath, exercise intolerance, palpitations, loss of consciousness Resp:  Negative for cough, wheezing, shortness of breath GI:  Negative for nausea, vomiting, diarrhea, bloody stools GU:  Positives noted in HPI; otherwise negative for gross hematuria, dysuria, urinary incontinence Neuro:  Negative for seizures, poor balance, limb weakness, slurred speech Psych:  Negative for lack of energy, depression, anxiety Endocrine:  Negative for polydipsia, polyuria, symptoms of hypoglycemia (dizziness, hunger, sweating) Hematologic:  Negative for anemia, purpura, petechia, prolonged or excessive bleeding, use of anticoagulants  Allergic:  Negative for difficulty breathing or choking as a result of exposure to anything; no shellfish allergy; no allergic response (rash/itch) to materials, foods  Physical exam: BP (!) 152/81   Pulse 80   Temp 98 F (36.7 C)   Wt 237 lb 3.2 oz (107.6 kg)   BMI 26.06 kg/m  GENERAL APPEARANCE:  Well appearing, well developed, well nourished, NAD HEENT: Atraumatic, Normocephalic, oropharynx clear. NECK: Supple without lymphadenopathy or thyromegaly. LUNGS: Clear to auscultation bilaterally. HEART: Regular Rate and Rhythm without murmurs, gallops, or rubs. ABDOMEN: Soft, non-tender, No Masses. EXTREMITIES: Moves all extremities well.  Without clubbing, cyanosis, or  edema. NEUROLOGIC:  Alert and oriented x 3, normal gait, CN II-XII grossly intact.  MENTAL STATUS:  Appropriate. BACK:  Non-tender to palpation.  No CVAT SKIN:  Warm, dry and intact.   GU:  foley draining yellow urine  Results: No specimen obtained

## 2021-04-09 ENCOUNTER — Encounter: Payer: Self-pay | Admitting: Urology

## 2021-04-09 ENCOUNTER — Other Ambulatory Visit: Payer: Self-pay

## 2021-04-09 ENCOUNTER — Ambulatory Visit (INDEPENDENT_AMBULATORY_CARE_PROVIDER_SITE_OTHER): Payer: No Typology Code available for payment source | Admitting: Urology

## 2021-04-09 VITALS — BP 135/79 | HR 80 | Temp 97.7°F | Wt 237.4 lb

## 2021-04-09 DIAGNOSIS — Z87898 Personal history of other specified conditions: Secondary | ICD-10-CM | POA: Diagnosis not present

## 2021-04-09 DIAGNOSIS — N138 Other obstructive and reflux uropathy: Secondary | ICD-10-CM

## 2021-04-09 DIAGNOSIS — R972 Elevated prostate specific antigen [PSA]: Secondary | ICD-10-CM | POA: Diagnosis not present

## 2021-04-09 DIAGNOSIS — N401 Enlarged prostate with lower urinary tract symptoms: Secondary | ICD-10-CM

## 2021-04-09 DIAGNOSIS — Z87440 Personal history of urinary (tract) infections: Secondary | ICD-10-CM

## 2021-04-09 LAB — URINALYSIS, ROUTINE W REFLEX MICROSCOPIC
Bilirubin, UA: NEGATIVE
Glucose, UA: NEGATIVE
Ketones, UA: NEGATIVE
Nitrite, UA: NEGATIVE
Protein,UA: NEGATIVE
Specific Gravity, UA: 1.015 (ref 1.005–1.030)
Urobilinogen, Ur: 0.2 mg/dL (ref 0.2–1.0)
pH, UA: 6 (ref 5.0–7.5)

## 2021-04-09 LAB — MICROSCOPIC EXAMINATION
Bacteria, UA: NONE SEEN
Renal Epithel, UA: NONE SEEN /hpf

## 2021-04-09 NOTE — Progress Notes (Signed)
post void residual= 1ML °

## 2021-04-09 NOTE — Progress Notes (Signed)
   Assessment: 1. BPH with obstruction/lower urinary tract symptoms   2. History of urinary retention   3. Elevated PSA   4. History of UTI     Plan: Continue silodosin, Urecholine, and finasteride. Return to office in 2 months. PSA and DRE next visit.  Chief Complaint: Chief Complaint  Patient presents with   Elevated PSA     HPI: Adam Reeves is a 65 y.o. male who presents for continued evaluation of urinary retention.  He presented to [facility] ER on 03/26/2021 with urinary retention.  He also noted gross hematuria.  He reports running out of his silodosin and Urecholine for approximately 2 weeks.  He attempted self-catheterization with some difficulty prior to going to the emergency room.  A Foley catheter was placed with return of bloody urine.  Urinalysis showed >100 RBCs, few bacteria.  Urine culture grew >100 K colonies.  CT imaging showed evidence of renal or ureteral calculi, no hydronephrosis, decompressed bladder, and enlarged prostate. His Foley catheter was removed on 03/30/2021.  He was continued on silodosin, Urecholine, and finasteride.  He was also treated with Bactrim x7 days.   He has a history of BPH with obstruction and was previously followed by [facility].  He has been managed medically with silodosin, finasteride, and Urecholine.  He did have a prior episode of urinary retention.  He has a history of elevated PSA.  His PSA in 3/19 was 8.9 with 12% free.  He has not wanted to proceed with a prostate biopsy.  He returns today for follow-up.  He continues on silodosin, Urecholine, and finasteride.  He is completing the Bactrim.  He is voiding spontaneously without difficulty.  He required self-catheterization 1 time after catheter removal.  No dysuria or gross hematuria.  He feels like he is back at his baseline. AUA score = 5 today.  Portions of the above documentation were copied from a prior visit for review purposes only.  Allergies: No Known  Allergies  PMH: Past Medical History:  Diagnosis Date   Hypertension     PSH: Past Surgical History:  Procedure Laterality Date   BACK SURGERY     HERNIA REPAIR      SH: Social History   Tobacco Use   Smoking status: Never   Smokeless tobacco: Never  Vaping Use   Vaping Use: Never used  Substance Use Topics   Alcohol use: Yes    Comment: occasional   Drug use: No    ROS: Constitutional:  Negative for fever, chills, weight loss CV: Negative for chest pain, previous MI, hypertension Respiratory:  Negative for shortness of breath, wheezing, sleep apnea, frequent cough GI:  Negative for nausea, vomiting, bloody stool, GERD  PE: BP 135/79   Pulse 80   Temp 97.7 F (36.5 C)   Wt 237 lb 6.4 oz (107.7 kg)   BMI 26.08 kg/m  GENERAL APPEARANCE:  Well appearing, well developed, well nourished, NAD HEENT:  Atraumatic, normocephalic, oropharynx clear NECK:  Supple without lymphadenopathy or thyromegaly ABDOMEN:  Soft, non-tender, no masses EXTREMITIES:  Moves all extremities well, without clubbing, cyanosis, or edema NEUROLOGIC:  Alert and oriented x 3, normal gait, CN II-XII grossly intact MENTAL STATUS:  appropriate BACK:  Non-tender to palpation, No CVAT SKIN:  Warm, dry, and intact   Results: U/A:  6-10 WBC, 0-2 RBC  PVR:  1 ml

## 2021-04-09 NOTE — Progress Notes (Signed)
rological Symptom Review  Patient is experiencing the following symptoms: N/A   Review of Systems  Gastrointestinal (upper)  : Negative for upper GI symptoms  Gastrointestinal (lower) : Negative for lower GI symptoms  Constitutional : Negative for symptoms  Skin: Negative for skin symptoms  Eyes: Negative for eye symptoms  Ear/Nose/Throat : Negative for Ear/Nose/Throat symptoms  Hematologic/Lymphatic: Negative for Hematologic/Lymphatic symptoms  Cardiovascular : Negative for cardiovascular symptoms  Respiratory : Negative for respiratory symptoms  Endocrine: Negative for endocrine symptoms  Musculoskeletal: Negative for musculoskeletal symptoms  Neurological: Negative for neurological symptoms  Psychologic: Negative for psychiatric symptoms

## 2021-04-23 ENCOUNTER — Other Ambulatory Visit: Payer: Self-pay | Admitting: Family

## 2021-04-23 DIAGNOSIS — I10 Essential (primary) hypertension: Secondary | ICD-10-CM

## 2021-04-23 DIAGNOSIS — N400 Enlarged prostate without lower urinary tract symptoms: Secondary | ICD-10-CM

## 2021-05-21 ENCOUNTER — Ambulatory Visit (INDEPENDENT_AMBULATORY_CARE_PROVIDER_SITE_OTHER): Payer: No Typology Code available for payment source | Admitting: Urology

## 2021-05-21 ENCOUNTER — Other Ambulatory Visit: Payer: Self-pay

## 2021-05-21 ENCOUNTER — Encounter: Payer: Self-pay | Admitting: Urology

## 2021-05-21 VITALS — BP 157/76 | HR 83

## 2021-05-21 DIAGNOSIS — Z87898 Personal history of other specified conditions: Secondary | ICD-10-CM

## 2021-05-21 DIAGNOSIS — R972 Elevated prostate specific antigen [PSA]: Secondary | ICD-10-CM

## 2021-05-21 DIAGNOSIS — Z87440 Personal history of urinary (tract) infections: Secondary | ICD-10-CM

## 2021-05-21 DIAGNOSIS — N401 Enlarged prostate with lower urinary tract symptoms: Secondary | ICD-10-CM

## 2021-05-21 DIAGNOSIS — N138 Other obstructive and reflux uropathy: Secondary | ICD-10-CM

## 2021-05-21 NOTE — Progress Notes (Signed)
Assessment: 1. BPH with obstruction/lower urinary tract symptoms   2. History of urinary retention   3. Elevated PSA   4. History of UTI     Plan: Continue silodosin, Urecholine, and finasteride. Free and total PSA today - will call with results Return to office in 3 months  Chief Complaint: Chief Complaint  Patient presents with   Benign Prostatic Hypertrophy    HPI: Adam Reeves is a 65 y.o. male who presents for continued evaluation of urinary retention.  He presented to [facility] ER on 03/26/2021 with urinary retention.  He also noted gross hematuria.  He reports running out of his silodosin and Urecholine for approximately 2 weeks.  He attempted self-catheterization with some difficulty prior to going to the emergency room.  A Foley catheter was placed with return of bloody urine.  Urinalysis showed >100 RBCs, few bacteria.  Urine culture grew >100 K colonies.  CT imaging showed evidence of renal or ureteral calculi, no hydronephrosis, decompressed bladder, and enlarged prostate. His Foley catheter was removed on 03/30/2021.  He was continued on silodosin, Urecholine, and finasteride.  He was also treated with Bactrim x7 days.   He has a history of BPH with obstruction and was previously followed by [facility].  He has been managed medically with silodosin, finasteride, and Urecholine.  He did have a prior episode of urinary retention.  He has a history of elevated PSA.  His PSA in 3/19 was 8.9 with 12% free.  He has not wanted to proceed with a prostate biopsy.  At his visit in 10/22, he continued on silodosin, Urecholine, and finasteride.  He completed the Bactrim.  He was voiding spontaneously without difficulty.  He required self-catheterization 1 time after catheter removal.  No dysuria or gross hematuria.  He felt like his LUTS were back at his baseline. AUA score = 5.  PVR = 1 ml.  He returns today for follow-up.  He continues on silodosin, finasteride, and  Urecholine.  He has not required self-catheterization since his last visit.  No change in his urinary symptoms.  No dysuria or gross hematuria. AUA score = 1 today.  Portions of the above documentation were copied from a prior visit for review purposes only.  Allergies: No Known Allergies  PMH: Past Medical History:  Diagnosis Date   Hypertension     PSH: Past Surgical History:  Procedure Laterality Date   BACK SURGERY     HERNIA REPAIR      SH: Social History   Tobacco Use   Smoking status: Never   Smokeless tobacco: Never  Vaping Use   Vaping Use: Never used  Substance Use Topics   Alcohol use: Yes    Comment: occasional   Drug use: No    ROS: Constitutional:  Negative for fever, chills, weight loss CV: Negative for chest pain, previous MI, hypertension Respiratory:  Negative for shortness of breath, wheezing, sleep apnea, frequent cough GI:  Negative for nausea, vomiting, bloody stool, GERD  PE: BP (!) 157/76   Pulse 83  GENERAL APPEARANCE:  Well appearing, well developed, well nourished, NAD HEENT:  Atraumatic, normocephalic, oropharynx clear NECK:  Supple without lymphadenopathy or thyromegaly ABDOMEN:  Soft, non-tender, no masses EXTREMITIES:  Moves all extremities well, without clubbing, cyanosis, or edema NEUROLOGIC:  Alert and oriented x 3, normal gait, CN II-XII grossly intact MENTAL STATUS:  appropriate BACK:  Non-tender to palpation, No CVAT SKIN:  Warm, dry, and intact GU: Prostate: 60 g, NT,  no nodules Rectum: Normal tone,  no masses or tenderness  Results: U/A: 0-5 WBC

## 2021-05-21 NOTE — Progress Notes (Signed)

## 2021-05-22 LAB — MICROSCOPIC EXAMINATION
Bacteria, UA: NONE SEEN
RBC, Urine: NONE SEEN /hpf (ref 0–2)
Renal Epithel, UA: NONE SEEN /hpf

## 2021-05-22 LAB — PSA, TOTAL AND FREE
PSA, Free Pct: 14.5 %
PSA, Free: 0.87 ng/mL
Prostate Specific Ag, Serum: 6 ng/mL — ABNORMAL HIGH (ref 0.0–4.0)

## 2021-05-22 LAB — URINALYSIS, ROUTINE W REFLEX MICROSCOPIC
Bilirubin, UA: NEGATIVE
Glucose, UA: NEGATIVE
Ketones, UA: NEGATIVE
Nitrite, UA: NEGATIVE
Protein,UA: NEGATIVE
RBC, UA: NEGATIVE
Specific Gravity, UA: 1.02 (ref 1.005–1.030)
Urobilinogen, Ur: 0.2 mg/dL (ref 0.2–1.0)
pH, UA: 6 (ref 5.0–7.5)

## 2021-06-05 ENCOUNTER — Encounter: Payer: Self-pay | Admitting: Urology

## 2021-08-27 ENCOUNTER — Ambulatory Visit: Payer: Self-pay | Admitting: Urology

## 2021-08-31 ENCOUNTER — Other Ambulatory Visit: Payer: Self-pay

## 2021-08-31 ENCOUNTER — Encounter: Payer: Self-pay | Admitting: Urology

## 2021-08-31 ENCOUNTER — Ambulatory Visit (INDEPENDENT_AMBULATORY_CARE_PROVIDER_SITE_OTHER): Payer: No Typology Code available for payment source | Admitting: Urology

## 2021-08-31 VITALS — BP 151/81 | HR 76

## 2021-08-31 DIAGNOSIS — N401 Enlarged prostate with lower urinary tract symptoms: Secondary | ICD-10-CM

## 2021-08-31 DIAGNOSIS — R8290 Unspecified abnormal findings in urine: Secondary | ICD-10-CM | POA: Diagnosis not present

## 2021-08-31 DIAGNOSIS — R972 Elevated prostate specific antigen [PSA]: Secondary | ICD-10-CM

## 2021-08-31 DIAGNOSIS — N138 Other obstructive and reflux uropathy: Secondary | ICD-10-CM

## 2021-08-31 DIAGNOSIS — Z87898 Personal history of other specified conditions: Secondary | ICD-10-CM | POA: Diagnosis not present

## 2021-08-31 LAB — MICROSCOPIC EXAMINATION
Epithelial Cells (non renal): NONE SEEN /hpf (ref 0–10)
Renal Epithel, UA: NONE SEEN /hpf

## 2021-08-31 LAB — URINALYSIS, ROUTINE W REFLEX MICROSCOPIC
Bilirubin, UA: NEGATIVE
Glucose, UA: NEGATIVE
Ketones, UA: NEGATIVE
Nitrite, UA: NEGATIVE
Protein,UA: NEGATIVE
Specific Gravity, UA: >1.03 — ABNORMAL HIGH (ref 1.005–1.030)
Urobilinogen, Ur: 0.2 mg/dL (ref 0.2–1.0)
pH, UA: 5.5 (ref 5.0–7.5)

## 2021-08-31 MED ORDER — BETHANECHOL CHLORIDE 25 MG PO TABS: 25.0000 mg | ORAL_TABLET | Freq: Two times a day (BID) | ORAL | 11 refills | Status: AC

## 2021-08-31 NOTE — Progress Notes (Signed)
Exosome urine sent ? ?Tracking [id number] ? ?Pick up number [id number] ?

## 2021-08-31 NOTE — Progress Notes (Signed)
? ?Assessment: ?1. Elevated PSA   ?2. BPH with obstruction/lower urinary tract symptoms   ?3. History of urinary retention   ?4. Abnormal urine findings   ? ? ?Plan: ?Continue silodosin, Urecholine, and finasteride. ?Urine culture sent today ?I discussed the potential significance of his elevated PSA results, specifically regarding the risk of prostate cancer.  I reviewed the options for further evaluation including urine based testing, MRI, or prostate biopsy.  He is not interested in pursuing a prostate biopsy at this time.  He would like to proceed with urine based testing. ?ExoDx ordered today.  We will contact him with results and additional recommendations. ?Return to office in 3 months ? ?Chief Complaint: ?Chief Complaint  ?Patient presents with  ? Elevated PSA  ? ?HPI: ?NOAL ABSHIER is a 66 y.o. male who presents for continued evaluation of urinary retention.  He presented to [facility] ER on 03/26/2021 with urinary retention.  He also noted gross hematuria.  He reports running out of his silodosin and Urecholine for approximately 2 weeks.  He attempted self-catheterization with some difficulty prior to going to the emergency room.  A Foley catheter was placed with return of bloody urine.  Urinalysis showed >100 RBCs, few bacteria.  Urine culture grew >100 K colonies.  CT imaging showed evidence of renal or ureteral calculi, no hydronephrosis, decompressed bladder, and enlarged prostate. ?His Foley catheter was removed on 03/30/2021.  He was continued on silodosin, Urecholine, and finasteride.  He was also treated with Bactrim x7 days. ?  ?He has a history of BPH with obstruction and was previously followed by [facility].  He has been managed medically with silodosin, finasteride, and Urecholine.  He reported a prior episode of urinary retention. ? ?He has a history of elevated PSA.  His PSA in 3/19 was 8.9 with 12% free.   ?PSA from 12/22 was 6.0 (12 corrected for finasteride) with 14.5% free.   He has not wanted to proceed with a prostate biopsy. ? ?At his visit in 10/22, he continued on silodosin, Urecholine, and finasteride.  He completed the Bactrim.  He was voiding spontaneously without difficulty.  He required self-catheterization 1 time after catheter removal.  No dysuria or gross hematuria.  He felt like his LUTS were back at his baseline. ?AUA score = 5.  PVR = 1 ml. ? ?At his visit in 12/22, he continued on silodosin, finasteride, and Urecholine.  He had not required self-catheterization and experienced no change in his urinary symptoms.  No dysuria or gross hematuria. ?AUA score = 1. ? ?He returns today for follow-up.  He continues on silodosin, finasteride, and Urecholine.  He reports that his lower urinary tract symptoms are stable.  He has not had any problems with retention and has not required catheterization.  He does have some frequency associated with increased caffeine intake.  No dysuria or gross hematuria. ?IPSS = 3 today. ? ?Portions of the above documentation were copied from a prior visit for review purposes only. ? ?Allergies: ?No Known Allergies ? ?PMH: ?Past Medical History:  ?Diagnosis Date  ? Hypertension   ? ? ?PSH: ?Past Surgical History:  ?Procedure Laterality Date  ? BACK SURGERY    ? HERNIA REPAIR    ? ? ?SH: ?Social History  ? ?Tobacco Use  ? Smoking status: Never  ? Smokeless tobacco: Never  ?Vaping Use  ? Vaping Use: Never used  ?Substance Use Topics  ? Alcohol use: Yes  ?  Comment: occasional  ?  Drug use: No  ? ? ?ROS: ?Constitutional:  Negative for fever, chills, weight loss ?CV: Negative for chest pain, previous MI, hypertension ?Respiratory:  Negative for shortness of breath, wheezing, sleep apnea, frequent cough ?GI:  Negative for nausea, vomiting, bloody stool, GERD ? ?PE: ?BP (!) 151/81   Pulse 76  ?GENERAL APPEARANCE:  Well appearing, well developed, well nourished, NAD ?HEENT:  Atraumatic, normocephalic, oropharynx clear ?NECK:  Supple without lymphadenopathy  or thyromegaly ?ABDOMEN:  Soft, non-tender, no masses ?EXTREMITIES:  Moves all extremities well, without clubbing, cyanosis, or edema ?NEUROLOGIC:  Alert and oriented x 3, normal gait, CN II-XII grossly intact ?MENTAL STATUS:  appropriate ?BACK:  Non-tender to palpation, No CVAT ?SKIN:  Warm, dry, and intact ? ?Results: ?U/A: 11-30 WBC, 0-2 RBC, mod bacteria. Nitrite negative ?

## 2021-09-02 LAB — URINE CULTURE

## 2021-09-02 MED ORDER — SULFAMETHOXAZOLE-TRIMETHOPRIM 800-160 MG PO TABS: 1.0000 | ORAL_TABLET | Freq: Two times a day (BID) | ORAL | 0 refills | Status: AC

## 2021-09-02 NOTE — Addendum Note (Signed)
Addended by: Primus Bravo on: 09/02/2021 08:12 PM ? ? Modules accepted: Orders ? ?

## 2021-09-03 ENCOUNTER — Telehealth: Payer: Self-pay

## 2021-09-03 NOTE — Telephone Encounter (Signed)
I called patient to inform of medication at pharmacy. Patient reports pharmacy did not have his medication ready prior to him leaving for [place]. ? ?I offered to send in abx to pharmacy where he is in [place], patient declined and stated he would pick up prescription when he returns end of week. ?

## 2021-09-03 NOTE — Telephone Encounter (Signed)
-----   Message from Milderd Meager, MD sent at 09/02/2021  8:12 PM EDT ----- ?Please notify patient to begin Bactrim DS x 7 days for UTI.  Rx sent. ?

## 2021-09-06 ENCOUNTER — Other Ambulatory Visit: Payer: Self-pay | Admitting: Urology

## 2021-09-12 ENCOUNTER — Telehealth: Payer: Self-pay

## 2021-09-12 ENCOUNTER — Telehealth: Payer: Self-pay | Admitting: Urology

## 2021-09-12 NOTE — Telephone Encounter (Signed)
ExoDx result was 48.6 indicating an increased risk of high-grade prostate cancer.  I discussed these results with Adam Reeves today.  Given his elevated PSA and the ExoDx result, I strongly recommended further evaluation with a prostate biopsy.  I also discussed the potential role of MRI before biopsy to potentially allow a targeted biopsy to be performed.  I discussed the potential risk of infection and bleeding associated with the biopsy.  Following our discussion, he did not wish to proceed with a biopsy or any additional evaluation for prostate cancer at this time.  He has a follow-up scheduled for June 2023 and wants to discuss this again at his next visit. ?

## 2021-09-12 NOTE — Telephone Encounter (Signed)
Patient is asking for you to call him back, he has questions about scheduling his scan.  I called patient back to see what his questions were and he insisted on you returning his call. ?

## 2021-09-13 ENCOUNTER — Other Ambulatory Visit: Payer: Self-pay | Admitting: Urology

## 2021-09-13 DIAGNOSIS — R972 Elevated prostate specific antigen [PSA]: Secondary | ICD-10-CM

## 2021-10-09 ENCOUNTER — Other Ambulatory Visit: Payer: Self-pay | Admitting: Family

## 2021-10-09 DIAGNOSIS — I10 Essential (primary) hypertension: Secondary | ICD-10-CM

## 2021-10-22 ENCOUNTER — Ambulatory Visit
Admission: RE | Admit: 2021-10-22 | Discharge: 2021-10-22 | Disposition: A | Discharge status: Home / Self Care | Payer: Medicare Other | Source: Ambulatory Visit | Attending: Urology | Admitting: Urology

## 2021-10-22 DIAGNOSIS — R972 Elevated prostate specific antigen [PSA]: Secondary | ICD-10-CM

## 2021-10-22 MED ORDER — GADOBENATE DIMEGLUMINE 529 MG/ML IV SOLN
20.0000 mL | Freq: Once | INTRAVENOUS | Status: AC | PRN
  Administered 2021-10-22: 20 mL via INTRAVENOUS

## 2021-10-25 ENCOUNTER — Encounter: Payer: Self-pay | Admitting: Urology

## 2021-11-30 ENCOUNTER — Ambulatory Visit (INDEPENDENT_AMBULATORY_CARE_PROVIDER_SITE_OTHER): Payer: No Typology Code available for payment source | Admitting: Urology

## 2021-11-30 ENCOUNTER — Encounter: Payer: Self-pay | Admitting: Urology

## 2021-11-30 VITALS — BP 139/71 | HR 69

## 2021-11-30 DIAGNOSIS — N401 Enlarged prostate with lower urinary tract symptoms: Secondary | ICD-10-CM | POA: Diagnosis not present

## 2021-11-30 DIAGNOSIS — Z87898 Personal history of other specified conditions: Secondary | ICD-10-CM

## 2021-11-30 DIAGNOSIS — R339 Retention of urine, unspecified: Secondary | ICD-10-CM

## 2021-11-30 DIAGNOSIS — R972 Elevated prostate specific antigen [PSA]: Secondary | ICD-10-CM

## 2021-11-30 DIAGNOSIS — N138 Other obstructive and reflux uropathy: Secondary | ICD-10-CM | POA: Diagnosis not present

## 2021-11-30 LAB — URINALYSIS, ROUTINE W REFLEX MICROSCOPIC
Bilirubin, UA: NEGATIVE
Glucose, UA: NEGATIVE
Ketones, UA: NEGATIVE
Leukocytes,UA: NEGATIVE
Nitrite, UA: NEGATIVE
Protein,UA: NEGATIVE
RBC, UA: NEGATIVE
Specific Gravity, UA: 1.015 (ref 1.005–1.030)
Urobilinogen, Ur: 0.2 mg/dL (ref 0.2–1.0)
pH, UA: 6 (ref 5.0–7.5)

## 2021-12-13 ENCOUNTER — Other Ambulatory Visit: Payer: Self-pay | Admitting: Family

## 2021-12-13 DIAGNOSIS — I10 Essential (primary) hypertension: Secondary | ICD-10-CM

## 2021-12-31 ENCOUNTER — Encounter: Payer: Self-pay | Admitting: Family

## 2021-12-31 ENCOUNTER — Ambulatory Visit (INDEPENDENT_AMBULATORY_CARE_PROVIDER_SITE_OTHER): Payer: No Typology Code available for payment source | Admitting: Family

## 2021-12-31 VITALS — BP 128/69 | HR 64 | Temp 97.9°F | Ht >= 80 in | Wt 241.8 lb

## 2021-12-31 DIAGNOSIS — I10 Essential (primary) hypertension: Secondary | ICD-10-CM

## 2021-12-31 DIAGNOSIS — E663 Overweight: Secondary | ICD-10-CM

## 2021-12-31 DIAGNOSIS — E782 Mixed hyperlipidemia: Secondary | ICD-10-CM

## 2021-12-31 DIAGNOSIS — N401 Enlarged prostate with lower urinary tract symptoms: Secondary | ICD-10-CM | POA: Diagnosis not present

## 2021-12-31 DIAGNOSIS — Z0000 Encounter for general adult medical examination without abnormal findings: Secondary | ICD-10-CM

## 2021-12-31 DIAGNOSIS — N138 Other obstructive and reflux uropathy: Secondary | ICD-10-CM

## 2021-12-31 DIAGNOSIS — R972 Elevated prostate specific antigen [PSA]: Secondary | ICD-10-CM

## 2021-12-31 MED ORDER — ATORVASTATIN CALCIUM 40 MG PO TABS: 40.0000 mg | ORAL_TABLET | Freq: Every day | ORAL | 4 refills | Status: DC

## 2021-12-31 MED ORDER — HYDROCHLOROTHIAZIDE 25 MG PO TABS: 25.0000 mg | ORAL_TABLET | Freq: Every day | ORAL | 4 refills | Status: DC

## 2021-12-31 MED ORDER — LISINOPRIL 40 MG PO TABS: 40.0000 mg | ORAL_TABLET | Freq: Every day | ORAL | 4 refills | Status: DC

## 2021-12-31 NOTE — Progress Notes (Signed)
Subjective:    Patient ID: Adam Reeves, male    DOB: 01-12-1956, 66 y.o.   MRN: [id number]  Chief Complaint  Patient presents with   Annual Exam   PT presents to the office today for chronic follow up. He is followed by Urologists every 6 months for BPH and elevated PSA.  Hypertension This is a chronic problem. The current episode started more than 1 year ago. The problem has been resolved since onset. The problem is controlled. Pertinent negatives include no malaise/fatigue, peripheral edema or shortness of breath. Risk factors for coronary artery disease include dyslipidemia and male gender. The current treatment provides moderate improvement. There is no history of CVA.  Hyperlipidemia This is a chronic problem. The current episode started more than 1 year ago. The problem is controlled. Pertinent negatives include no shortness of breath. Current antihyperlipidemic treatment includes statins. The current treatment provides moderate improvement of lipids. Risk factors for coronary artery disease include dyslipidemia, male sex, hypertension and a sedentary lifestyle.  Benign Prostatic Hypertrophy This is a chronic problem. The problem has been resolved since onset. Irritative symptoms include nocturia.      Review of Systems  Constitutional:  Negative for malaise/fatigue.  Respiratory:  Negative for shortness of breath.   Genitourinary:  Positive for nocturia.  All other systems reviewed and are negative.      Objective:   Physical Exam Vitals reviewed.  Constitutional:      General: He is not in acute distress.    Appearance: He is well-developed.  HENT:     Head: Normocephalic.     Right Ear: Tympanic membrane normal.     Left Ear: Tympanic membrane normal.  Eyes:     General:        Right eye: No discharge.        Left eye: No discharge.     Pupils: Pupils are equal, round, and reactive to light.  Neck:     Thyroid: No thyromegaly.  Cardiovascular:     Rate and  Rhythm: Normal rate and regular rhythm.     Heart sounds: Normal heart sounds. No murmur heard. Pulmonary:     Effort: Pulmonary effort is normal. No respiratory distress.     Breath sounds: Normal breath sounds. No wheezing.  Abdominal:     General: Bowel sounds are normal. There is no distension.     Palpations: Abdomen is soft.     Tenderness: There is no abdominal tenderness.  Musculoskeletal:        General: No tenderness. Normal range of motion.     Cervical back: Normal range of motion and neck supple.  Skin:    General: Skin is warm and dry.     Findings: No erythema or rash.  Neurological:     Mental Status: He is alert and oriented to person, place, and time.     Cranial Nerves: No cranial nerve deficit.     Deep Tendon Reflexes: Reflexes are normal and symmetric.  Psychiatric:        Behavior: Behavior normal.        Thought Content: Thought content normal.        Judgment: Judgment normal.      BP 128/69   Pulse 64   Temp 97.9 F (36.6 C)   Ht _0  (2.032 m)   Wt 241 lb 12.8 oz (109.7 kg)   SpO2 100%   BMI 26.56 kg/m      Assessment & Plan:  [name] comes in today with chief complaint of Annual Exam   Diagnosis and orders addressed: 1. Adult general medical exam - CMP14+EGFR - CBC with Differential/Platelet - Lipid panel - TSH  2. Essential (primary) hypertension - hydrochlorothiazide (HYDRODIURIL) 25 MG tablet; Take 1 tablet (25 mg total) by mouth daily.  Dispense: 90 tablet; Refill: 4 - lisinopril (ZESTRIL) 40 MG tablet; Take 1 tablet (40 mg total) by mouth daily.  Dispense: 90 tablet; Refill: 4 - CMP14+EGFR - CBC with Differential/Platelet  3. BPH with obstruction/lower urinary tract symptoms - CMP14+EGFR - CBC with Differential/Platelet - TSH  4. Mixed hyperlipidemia - atorvastatin (LIPITOR) 40 MG tablet; Take 1 tablet (40 mg total) by mouth daily.  Dispense: 90 tablet; Refill: 4 - CMP14+EGFR - CBC with Differential/Platelet -  Lipid panel - TSH  5. Overweight (BMI 25.0-29.9)  - CMP14+EGFR - CBC with Differential/Platelet  6. Elevated PSA - CMP14+EGFR - CBC with Differential/Platelet   Labs pending Health Maintenance reviewed Diet and exercise encouraged  Follow up plan: 6 months   Evelina Dun, FNP

## 2021-12-31 NOTE — Patient Instructions (Signed)
Health Maintenance After Age 67 After age 67, you are at a higher risk for certain long-term diseases and infections as well as injuries from falls. Falls are a major cause of broken bones and head injuries in people who are older than age 67. Getting regular preventive care can help to keep you healthy and well. Preventive care includes getting regular testing and making lifestyle changes as recommended by your health care provider. Talk with your health care provider about: Which screenings and tests you should have. A screening is a test that checks for a disease when you have no symptoms. A diet and exercise plan that is right for you. What should I know about screenings and tests to prevent falls? Screening and testing are the best ways to find a health problem early. Early diagnosis and treatment give you the best chance of managing medical conditions that are common after age 67. Certain conditions and lifestyle choices may make you more likely to have a fall. Your health care provider may recommend: Regular vision checks. Poor vision and conditions such as cataracts can make you more likely to have a fall. If you wear glasses, make sure to get your prescription updated if your vision changes. Medicine review. Work with your health care provider to regularly review all of the medicines you are taking, including over-the-counter medicines. Ask your health care provider about any side effects that may make you more likely to have a fall. Tell your health care provider if any medicines that you take make you feel dizzy or sleepy. Strength and balance checks. Your health care provider may recommend certain tests to check your strength and balance while standing, walking, or changing positions. Foot health exam. Foot pain and numbness, as well as not wearing proper footwear, can make you more likely to have a fall. Screenings, including: Osteoporosis screening. Osteoporosis is a condition that causes  the bones to get weaker and break more easily. Blood pressure screening. Blood pressure changes and medicines to control blood pressure can make you feel dizzy. Depression screening. You may be more likely to have a fall if you have a fear of falling, feel depressed, or feel unable to do activities that you used to do. Alcohol use screening. Using too much alcohol can affect your balance and may make you more likely to have a fall. Follow these instructions at home: Lifestyle Do not drink alcohol if: Your health care provider tells you not to drink. If you drink alcohol: Limit how much you have to: 0-1 drink a day for women. 0-2 drinks a day for men. Know how much alcohol is in your drink. In the [place] one drink equals one 12 oz bottle of beer (355 mL), one 5 oz glass of wine (148 mL), or one 1 oz glass of hard liquor (44 mL). Do not use any products that contain nicotine or tobacco. These products include cigarettes, chewing tobacco, and vaping devices, such as e-cigarettes. If you need help quitting, ask your health care provider. Activity  Follow a regular exercise program to stay fit. This will help you maintain your balance. Ask your health care provider what types of exercise are appropriate for you. If you need a cane or walker, use it as recommended by your health care provider. Wear supportive shoes that have nonskid soles. Safety  Remove any tripping hazards, such as rugs, cords, and clutter. Install safety equipment such as grab bars in bathrooms and safety rails on stairs. Keep rooms and walkways   well-lit. General instructions Talk with your health care provider about your risks for falling. Tell your health care provider if: You fall. Be sure to tell your health care provider about all falls, even ones that seem minor. You feel dizzy, tiredness (fatigue), or off-balance. Take over-the-counter and prescription medicines only as told by your health care provider. These include  supplements. Eat a healthy diet and maintain a healthy weight. A healthy diet includes low-fat dairy products, low-fat (lean) meats, and fiber from whole grains, beans, and lots of fruits and vegetables. Stay current with your vaccines. Schedule regular health, dental, and eye exams. Summary Having a healthy lifestyle and getting preventive care can help to protect your health and wellness after age 67. Screening and testing are the best way to find a health problem early and help you avoid having a fall. Early diagnosis and treatment give you the best chance for managing medical conditions that are more common for people who are older than age 67. Falls are a major cause of broken bones and head injuries in people who are older than age 67. Take precautions to prevent a fall at home. Work with your health care provider to learn what changes you can make to improve your health and wellness and to prevent falls. This information is not intended to replace advice given to you by your health care provider. Make sure you discuss any questions you have with your health care provider. Document Revised: 10/16/2020 Document Reviewed: 10/16/2020 Elsevier Patient Education  2023 [employer]vier Inc.  

## 2022-01-01 LAB — CBC WITH DIFFERENTIAL/PLATELET
Basophils Absolute: 0 10*3/uL (ref 0.0–0.2)
Basos: 1 %
EOS (ABSOLUTE): 0.2 10*3/uL (ref 0.0–0.4)
Eos: 3 %
Hematocrit: 39 % (ref 37.5–51.0)
Hemoglobin: 13.3 g/dL (ref 13.0–17.7)
Immature Grans (Abs): 0 10*3/uL (ref 0.0–0.1)
Immature Granulocytes: 1 %
Lymphocytes Absolute: 1.4 10*3/uL (ref 0.7–3.1)
Lymphs: 26 %
MCH: 29.6 pg (ref 26.6–33.0)
MCHC: 34.1 g/dL (ref 31.5–35.7)
MCV: 87 fL (ref 79–97)
Monocytes Absolute: 0.5 10*3/uL (ref 0.1–0.9)
Monocytes: 10 %
Neutrophils Absolute: 3.3 10*3/uL (ref 1.4–7.0)
Neutrophils: 59 %
Platelets: 185 10*3/uL (ref 150–450)
RBC: 4.5 x10E6/uL (ref 4.14–5.80)
RDW: 12.2 % (ref 11.6–15.4)
WBC: 5.4 10*3/uL (ref 3.4–10.8)

## 2022-01-01 LAB — CMP14+EGFR
ALT: 31 IU/L (ref 0–44)
AST: 20 IU/L (ref 0–40)
Albumin/Globulin Ratio: 1.7 (ref 1.2–2.2)
Albumin: 4.3 g/dL (ref 3.9–4.9)
Alkaline Phosphatase: 102 IU/L (ref 44–121)
BUN/Creatinine Ratio: 17 (ref 10–24)
BUN: 15 mg/dL (ref 8–27)
Bilirubin Total: 0.3 mg/dL (ref 0.0–1.2)
CO2: 23 mmol/L (ref 20–29)
Calcium: 9.5 mg/dL (ref 8.6–10.2)
Chloride: 106 mmol/L (ref 96–106)
Creatinine, Ser: 0.87 mg/dL (ref 0.76–1.27)
Globulin, Total: 2.5 g/dL (ref 1.5–4.5)
Glucose: 106 mg/dL — ABNORMAL HIGH (ref 70–99)
Potassium: 4.1 mmol/L (ref 3.5–5.2)
Sodium: 141 mmol/L (ref 134–144)
Total Protein: 6.8 g/dL (ref 6.0–8.5)
eGFR: 95 mL/min/{1.73_m2} (ref >59)

## 2022-01-01 LAB — LIPID PANEL
Chol/HDL Ratio: 3.3 ratio (ref 0.0–5.0)
Cholesterol, Total: 128 mg/dL (ref 100–199)
HDL: 39 mg/dL — ABNORMAL LOW (ref >39)
LDL Chol Calc (NIH): 72 mg/dL (ref 0–99)
Triglycerides: 89 mg/dL (ref 0–149)
VLDL Cholesterol Cal: 17 mg/dL (ref 5–40)

## 2022-01-01 LAB — TSH: TSH: 1.23 u[IU]/mL (ref 0.450–4.500)

## 2022-01-03 ENCOUNTER — Encounter: Payer: PRIVATE HEALTH INSURANCE | Admitting: Family

## 2022-01-04 ENCOUNTER — Encounter: Payer: PRIVATE HEALTH INSURANCE | Admitting: Family

## 2022-03-31 ENCOUNTER — Other Ambulatory Visit: Payer: Self-pay | Admitting: Urology

## 2022-03-31 DIAGNOSIS — N138 Other obstructive and reflux uropathy: Secondary | ICD-10-CM

## 2022-06-04 ENCOUNTER — Ambulatory Visit: Payer: No Typology Code available for payment source | Admitting: Urology

## 2022-06-05 ENCOUNTER — Ambulatory Visit: Payer: No Typology Code available for payment source | Admitting: Urology

## 2022-06-24 ENCOUNTER — Ambulatory Visit (INDEPENDENT_AMBULATORY_CARE_PROVIDER_SITE_OTHER): Payer: No Typology Code available for payment source | Admitting: Urology

## 2022-06-24 ENCOUNTER — Encounter: Payer: Self-pay | Admitting: Urology

## 2022-06-24 VITALS — BP 168/84 | HR 82 | Wt 235.0 lb

## 2022-06-24 DIAGNOSIS — N401 Enlarged prostate with lower urinary tract symptoms: Secondary | ICD-10-CM | POA: Diagnosis not present

## 2022-06-24 DIAGNOSIS — N138 Other obstructive and reflux uropathy: Secondary | ICD-10-CM

## 2022-06-24 LAB — MICROSCOPIC EXAMINATION: Bacteria, UA: NONE SEEN

## 2022-06-24 LAB — URINALYSIS, ROUTINE W REFLEX MICROSCOPIC
Bilirubin, UA: NEGATIVE
Glucose, UA: NEGATIVE
Ketones, UA: NEGATIVE
Leukocytes,UA: NEGATIVE
Nitrite, UA: NEGATIVE
Protein,UA: NEGATIVE
Specific Gravity, UA: 1.01 (ref 1.005–1.030)
Urobilinogen, Ur: 0.2 mg/dL (ref 0.2–1.0)
pH, UA: 6 (ref 5.0–7.5)

## 2022-06-24 NOTE — Progress Notes (Signed)
06/24/2022 9:40 AM   Adam Reeves December 30, 1955 [id number]  Referring provider: Sharion Balloon, [address] [place],  [place] [address]  No chief complaint on file.   HPI:  F/u -   1) BPH - presented with urinary retention 10/22. His prostate was 230 g on MRI 05/23. He was on silodosin but ran out and on finasteride. He tried CIC and failed. CT benign - BPH. He passed a void trial 10/22.   F/u AUA score = 5.  PVR = 1 ml.  2) PSA elevation - his PSA in 03/19 was 8.9 with 12% free. PSA from 12/22 was 6.0 (12 corrected for finasteride) with 14.5% free.  He has not wanted to proceed with a prostate biopsy.   03/23 ExoDx test was 48.61 indicating an increased risk of high-grade prostate cancer. He underwent further evaluation with a MRI of the prostate on 05/23 which showed a prostate volume of 230 mL, no areas suspicious for high-grade prostate cancer, diffuse bladder wall thickening consistent with chronic bladder outlet obstruction.   He returns today for follow-up.  He continues on silodosin, finasteride, and Urecholine.  His urinary symptoms remain stable.  He has not required catheterization.  No dysuria or gross hematuria.  IPSS = 0 today.  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    PMH: Past Medical History:  Diagnosis Date   Hypertension     Surgical History: Past Surgical History:  Procedure Laterality Date   BACK SURGERY     HERNIA REPAIR      Home Medications:  Allergies as of 06/24/2022   No Known Allergies      Medication List        Accurate as of June 24, 2022  9:40 AM. If you have any questions, ask your nurse or doctor.          atorvastatin 40 MG tablet Commonly known as: LIPITOR Take 1 tablet (40 mg total) by mouth daily.   bethanechol 25 MG tablet Commonly known as: URECHOLINE Take 1 tablet (25 mg total) by mouth in the morning and at bedtime.   finasteride 5 MG tablet Commonly known as: PROSCAR Take 1 tablet by mouth once  daily   hydrochlorothiazide 25 MG tablet Commonly known as: HYDRODIURIL Take 1 tablet (25 mg total) by mouth daily.   lisinopril 40 MG tablet Commonly known as: ZESTRIL Take 1 tablet (40 mg total) by mouth daily.   silodosin 8 MG Caps capsule Commonly known as: RAPAFLO TAKE 1 CAPSULE BY MOUTH ONCE DAILY WITH BREAKFAST        Allergies: No Known Allergies  Family History: No family history on file.  Social History:  reports that he has never smoked. He has never used smokeless tobacco. He reports current alcohol use. He reports that he does not use drugs.   Physical Exam: BP (!) 168/84   Pulse 82   Wt 235 lb (106.6 kg)   BMI 25.82 kg/m   Constitutional:  Alert and oriented, No acute distress. HEENT: [place] AT, moist mucus membranes.  Trachea midline, no masses. Cardiovascular: No clubbing, cyanosis, or edema. Respiratory: Normal respiratory effort, no increased work of breathing. GI: Abdomen is soft, nontender, nondistended, no abdominal masses GU: No CVA tenderness Lymph: No cervical or inguinal lymphadenopathy. Skin: No rashes, bruises or suspicious lesions. Neurologic: Grossly intact, no focal deficits, moving all 4 extremities. Psychiatric: Normal mood and affect. DRE: 100 g and smooth - no hard area  or nodule.   Laboratory Data: Lab Results  Component Value Date   WBC 5.4 12/31/2021   HGB 13.3 12/31/2021   HCT 39.0 12/31/2021   MCV 87 12/31/2021   PLT 185 12/31/2021    Lab Results  Component Value Date   CREATININE 0.87 12/31/2021    No results found for: "PSA"  No results found for: "TESTOSTERONE"  No results found for: "HGBA1C"  Urinalysis    Component Value Date/Time   COLORURINE RED (A) 08/27/2014 0051   APPEARANCEUR Clear 11/30/2021 1132   LABSPEC 1.015 08/27/2014 0051   PHURINE 5.0 08/27/2014 0051   GLUCOSEU Negative 11/30/2021 1132   HGBUR LARGE (A) 08/27/2014 0051   BILIRUBINUR Negative 11/30/2021 1132   KETONESUR TRACE (A) 08/27/2014  0051   PROTEINUR Negative 11/30/2021 1132   PROTEINUR >300 (A) 08/27/2014 0051   UROBILINOGEN 2.0 (H) 08/27/2014 0051   NITRITE Negative 11/30/2021 1132   NITRITE POSITIVE (A) 08/27/2014 0051   LEUKOCYTESUR Negative 11/30/2021 1132    Lab Results  Component Value Date   LABMICR Comment 11/30/2021   WBCUA 11-30 (A) 08/31/2021   LABEPIT None seen 08/31/2021   BACTERIA Moderate (A) 08/31/2021    Pertinent Imaging:  pMRI images reviewed    Assessment & Plan:    1. BPH with obstruction/lower urinary tract symptoms Cont silodosin and finasteride - also disc surgery if needed.   - Urinalysis, Routine w reflex microscopic  2. PSA - I recommended a PSA today but he is concerned about the bill and a dispute he has with [facility]. He will be getting labs with NP Hawks soon and I sent her a message to check a PSA. He refuses to get one here.   No follow-ups on file.  Festus Aloe, MD  [facility]  [address] [place], [place] [address] [phone]

## 2022-07-05 ENCOUNTER — Encounter: Payer: Self-pay | Admitting: Family

## 2022-07-05 ENCOUNTER — Ambulatory Visit (INDEPENDENT_AMBULATORY_CARE_PROVIDER_SITE_OTHER): Payer: PRIVATE HEALTH INSURANCE | Admitting: Family

## 2022-07-05 VITALS — BP 133/78 | HR 83 | Temp 97.3°F | Ht >= 80 in | Wt 244.8 lb

## 2022-07-05 DIAGNOSIS — R972 Elevated prostate specific antigen [PSA]: Secondary | ICD-10-CM

## 2022-07-05 DIAGNOSIS — N138 Other obstructive and reflux uropathy: Secondary | ICD-10-CM

## 2022-07-05 DIAGNOSIS — E663 Overweight: Secondary | ICD-10-CM | POA: Diagnosis not present

## 2022-07-05 DIAGNOSIS — I10 Essential (primary) hypertension: Secondary | ICD-10-CM

## 2022-07-05 DIAGNOSIS — N401 Enlarged prostate with lower urinary tract symptoms: Secondary | ICD-10-CM | POA: Diagnosis not present

## 2022-07-05 DIAGNOSIS — E782 Mixed hyperlipidemia: Secondary | ICD-10-CM | POA: Diagnosis not present

## 2022-07-05 NOTE — Progress Notes (Signed)
Subjective:    Patient ID: Adam Reeves, male    DOB: 1956-01-28, 67 y.o.   MRN: [id number]  Chief Complaint  Patient presents with   Medical Management of Chronic Issues   PT presents to the office today for chronic follow up. He is followed by Urologists every 6 months for BPH and elevated PSA.   Hypertension This is a chronic problem. The current episode started more than 1 year ago. The problem has been resolved since onset. The problem is controlled. Pertinent negatives include no malaise/fatigue, peripheral edema or shortness of breath. Risk factors for coronary artery disease include dyslipidemia and male gender. The current treatment provides moderate improvement.  Benign Prostatic Hypertrophy This is a chronic problem. The current episode started more than 1 year ago. Irritative symptoms include nocturia and urgency. Past treatments include finasteride. The treatment provided moderate relief.  Hyperlipidemia This is a chronic problem. The current episode started more than 1 year ago. The problem is controlled. Recent lipid tests were reviewed and are normal. Pertinent negatives include no shortness of breath. Current antihyperlipidemic treatment includes statins. The current treatment provides moderate improvement of lipids. Risk factors for coronary artery disease include dyslipidemia, hypertension, male sex and a sedentary lifestyle.      Review of Systems  Constitutional:  Negative for malaise/fatigue.  Respiratory:  Negative for shortness of breath.   Genitourinary:  Positive for nocturia and urgency.  All other systems reviewed and are negative.      Objective:   Physical Exam Vitals reviewed.  Constitutional:      General: He is not in acute distress.    Appearance: He is well-developed.  HENT:     Head: Normocephalic.     Right Ear: Tympanic membrane normal.     Left Ear: Tympanic membrane normal.  Eyes:     General:        Right eye: No discharge.         Left eye: No discharge.     Pupils: Pupils are equal, round, and reactive to light.  Neck:     Thyroid: No thyromegaly.  Cardiovascular:     Rate and Rhythm: Normal rate and regular rhythm.     Heart sounds: Normal heart sounds. No murmur heard. Pulmonary:     Effort: Pulmonary effort is normal. No respiratory distress.     Breath sounds: Normal breath sounds. No wheezing.  Abdominal:     General: Bowel sounds are normal. There is no distension.     Palpations: Abdomen is soft.     Tenderness: There is no abdominal tenderness.  Musculoskeletal:        General: No tenderness. Normal range of motion.     Cervical back: Normal range of motion and neck supple.  Skin:    General: Skin is warm and dry.     Findings: No erythema or rash.  Neurological:     Mental Status: He is alert and oriented to person, place, and time.     Cranial Nerves: No cranial nerve deficit.     Deep Tendon Reflexes: Reflexes are normal and symmetric.  Psychiatric:        Behavior: Behavior normal.        Thought Content: Thought content normal.        Judgment: Judgment normal.       BP 133/78   Pulse 83   Temp (!) 97.3 F (36.3 C) (Temporal)   Ht 6\' 8"  (2.032 m)   Wt  244 lb 12.8 oz (111 kg)   SpO2 98%   BMI 26.89 kg/m      Assessment & Plan:  Adam Reeves comes in today with chief complaint of Medical Management of Chronic Issues   Diagnosis and orders addressed:  1. Mixed hyperlipidemia - CMP14+EGFR  2. Essential (primary) hypertension - CMP14+EGFR  3. BPH with obstruction/lower urinary tract symptoms - CMP14+EGFR - PSA, total and free  4. Overweight (BMI 25.0-29.9 - CMP14+EGFR  5. Elevated PSA  - CMP14+EGFR - PSA, total and free   Labs pending Health Maintenance reviewed Diet and exercise encouraged  Follow up plan: 6 months    Adam Dun, FNP

## 2022-07-05 NOTE — Patient Instructions (Signed)
Health Maintenance After Age 68 After age 68, you are at a higher risk for certain long-term diseases and infections as well as injuries from falls. Falls are a major cause of broken bones and head injuries in people who are older than age 68. Getting regular preventive care can help to keep you healthy and well. Preventive care includes getting regular testing and making lifestyle changes as recommended by your health care provider. Talk with your health care provider about: Which screenings and tests you should have. A screening is a test that checks for a disease when you have no symptoms. A diet and exercise plan that is right for you. What should I know about screenings and tests to prevent falls? Screening and testing are the best ways to find a health problem early. Early diagnosis and treatment give you the best chance of managing medical conditions that are common after age 68. Certain conditions and lifestyle choices may make you more likely to have a fall. Your health care provider may recommend: Regular vision checks. Poor vision and conditions such as cataracts can make you more likely to have a fall. If you wear glasses, make sure to get your prescription updated if your vision changes. Medicine review. Work with your health care provider to regularly review all of the medicines you are taking, including over-the-counter medicines. Ask your health care provider about any side effects that may make you more likely to have a fall. Tell your health care provider if any medicines that you take make you feel dizzy or sleepy. Strength and balance checks. Your health care provider may recommend certain tests to check your strength and balance while standing, walking, or changing positions. Foot health exam. Foot pain and numbness, as well as not wearing proper footwear, can make you more likely to have a fall. Screenings, including: Osteoporosis screening. Osteoporosis is a condition that causes  the bones to get weaker and break more easily. Blood pressure screening. Blood pressure changes and medicines to control blood pressure can make you feel dizzy. Depression screening. You may be more likely to have a fall if you have a fear of falling, feel depressed, or feel unable to do activities that you used to do. Alcohol use screening. Using too much alcohol can affect your balance and may make you more likely to have a fall. Follow these instructions at home: Lifestyle Do not drink alcohol if: Your health care provider tells you not to drink. If you drink alcohol: Limit how much you have to: 0-1 drink a day for women. 0-2 drinks a day for men. Know how much alcohol is in your drink. In the [place] one drink equals one 12 oz bottle of beer (355 mL), one 5 oz glass of wine (148 mL), or one 1 oz glass of hard liquor (44 mL). Do not use any products that contain nicotine or tobacco. These products include cigarettes, chewing tobacco, and vaping devices, such as e-cigarettes. If you need help quitting, ask your health care provider. Activity  Follow a regular exercise program to stay fit. This will help you maintain your balance. Ask your health care provider what types of exercise are appropriate for you. If you need a cane or walker, use it as recommended by your health care provider. Wear supportive shoes that have nonskid soles. Safety  Remove any tripping hazards, such as rugs, cords, and clutter. Install safety equipment such as grab bars in bathrooms and safety rails on stairs. Keep rooms and walkways   well-lit. General instructions Talk with your health care provider about your risks for falling. Tell your health care provider if: You fall. Be sure to tell your health care provider about all falls, even ones that seem minor. You feel dizzy, tiredness (fatigue), or off-balance. Take over-the-counter and prescription medicines only as told by your health care provider. These include  supplements. Eat a healthy diet and maintain a healthy weight. A healthy diet includes low-fat dairy products, low-fat (lean) meats, and fiber from whole grains, beans, and lots of fruits and vegetables. Stay current with your vaccines. Schedule regular health, dental, and eye exams. Summary Having a healthy lifestyle and getting preventive care can help to protect your health and wellness after age 68. Screening and testing are the best way to find a health problem early and help you avoid having a fall. Early diagnosis and treatment give you the best chance for managing medical conditions that are more common for people who are older than age 68. Falls are a major cause of broken bones and head injuries in people who are older than age 68. Take precautions to prevent a fall at home. Work with your health care provider to learn what changes you can make to improve your health and wellness and to prevent falls. This information is not intended to replace advice given to you by your health care provider. Make sure you discuss any questions you have with your health care provider. Document Revised: 10/16/2020 Document Reviewed: 10/16/2020 Elsevier Patient Education  2023 [employer]  

## 2022-07-06 LAB — CMP14+EGFR
ALT: 26 IU/L (ref 0–44)
AST: 22 IU/L (ref 0–40)
Albumin/Globulin Ratio: 1.8 (ref 1.2–2.2)
Albumin: 4.6 g/dL (ref 3.9–4.9)
Alkaline Phosphatase: 115 IU/L (ref 44–121)
BUN/Creatinine Ratio: 12 (ref 10–24)
BUN: 12 mg/dL (ref 8–27)
Bilirubin Total: 0.4 mg/dL (ref 0.0–1.2)
CO2: 20 mmol/L (ref 20–29)
Calcium: 9.4 mg/dL (ref 8.6–10.2)
Chloride: 103 mmol/L (ref 96–106)
Creatinine, Ser: 0.99 mg/dL (ref 0.76–1.27)
Globulin, Total: 2.5 g/dL (ref 1.5–4.5)
Glucose: 107 mg/dL — ABNORMAL HIGH (ref 70–99)
Potassium: 4.2 mmol/L (ref 3.5–5.2)
Sodium: 138 mmol/L (ref 134–144)
Total Protein: 7.1 g/dL (ref 6.0–8.5)
eGFR: 83 mL/min/{1.73_m2} (ref >59)

## 2022-07-06 LAB — PSA, TOTAL AND FREE
PSA, Free Pct: 19.3 %
PSA, Free: 0.89 ng/mL
Prostate Specific Ag, Serum: 4.6 ng/mL — ABNORMAL HIGH (ref 0.0–4.0)

## 2022-07-14 NOTE — Patient Instructions (Incomplete)
[name] , Thank you for taking time to come for your Medicare Wellness Visit. I appreciate your ongoing commitment to your health goals. Please review the following plan we discussed and let me know if I can assist you in the future.   These are the goals we discussed:  Goals   None     This is a list of the screening recommended for you and due dates:  Health Maintenance  Topic Date Due   Medicare Annual Wellness Visit  Never done   DTaP/Tdap/Td vaccine (2 - Td or Tdap) 04/27/2005   COVID-19 Vaccine (1) 07/21/2022*   Flu Shot  09/08/2022*   Zoster (Shingles) Vaccine (1 of 2) 10/04/2022*   Pneumonia Vaccine (1 - PCV) 01/01/2023*   Cologuard (Stool DNA test)  10/06/2023   Hepatitis C Screening: USPSTF Recommendation to screen - Ages 56-79 yo.  Completed   HPV Vaccine  Aged Out  *Topic was postponed. The date shown is not the original due date.    Advanced directives: ***  Conditions/risks identified: Aim for 30 minutes of exercise or brisk walking, 6-8 glasses of water, and 5 servings of fruits and vegetables each day.   Next appointment: Follow up in one year for your annual wellness visit.  Preventive Care 93 Years and Older, Male  Preventive care refers to lifestyle choices and visits with your health care provider that can promote health and wellness. What does preventive care include? A yearly physical exam. This is also called an annual well check. Dental exams once or twice a year. Routine eye exams. Ask your health care provider how often you should have your eyes checked. Personal lifestyle choices, including: Daily care of your teeth and gums. Regular physical activity. Eating a healthy diet. Avoiding tobacco and drug use. Limiting alcohol use. Practicing safe sex. Taking low doses of aspirin every day. Taking vitamin and mineral supplements as recommended by your health care provider. What happens during an annual well check? The services and screenings done  by your health care provider during your annual well check will depend on your age, overall health, lifestyle risk factors, and family history of disease. Counseling  Your health care provider may ask you questions about your: Alcohol use. Tobacco use. Drug use. Emotional well-being. Home and relationship well-being. Sexual activity. Eating habits. History of falls. Memory and ability to understand (cognition). Work and work Statistician. Screening  You may have the following tests or measurements: Height, weight, and BMI. Blood pressure. Lipid and cholesterol levels. These may be checked every 5 years, or more frequently if you are over 91 years old. Skin check. Lung cancer screening. You may have this screening every year starting at age 74 if you have a 30-pack-year history of smoking and currently smoke or have quit within the past 15 years. Fecal occult blood test (FOBT) of the stool. You may have this test every year starting at age 12. Flexible sigmoidoscopy or colonoscopy. You may have a sigmoidoscopy every 5 years or a colonoscopy every 10 years starting at age 75. Prostate cancer screening. Recommendations will vary depending on your family history and other risks. Hepatitis C blood test. Hepatitis B blood test. Sexually transmitted disease (STD) testing. Diabetes screening. This is done by checking your blood sugar (glucose) after you have not eaten for a while (fasting). You may have this done every 1-3 years. Abdominal aortic aneurysm (AAA) screening. You may need this if you are a current or former smoker. Osteoporosis. You may be  screened starting at age 51 if you are at high risk. Talk with your health care provider about your test results, treatment options, and if necessary, the need for more tests. Vaccines  Your health care provider may recommend certain vaccines, such as: Influenza vaccine. This is recommended every year. Tetanus, diphtheria, and acellular  pertussis (Tdap, Td) vaccine. You may need a Td booster every 10 years. Zoster vaccine. You may need this after age 73. Pneumococcal 13-valent conjugate (PCV13) vaccine. One dose is recommended after age 69. Pneumococcal polysaccharide (PPSV23) vaccine. One dose is recommended after age 24. Talk to your health care provider about which screenings and vaccines you need and how often you need them. This information is not intended to replace advice given to you by your health care provider. Make sure you discuss any questions you have with your health care provider. Document Released: 06/23/2015 Document Revised: 02/14/2016 Document Reviewed: 03/28/2015 Elsevier Interactive Patient Education  2017 [place] Prevention in the Home Falls can cause injuries. They can happen to people of all ages. There are many things you can do to make your home safe and to help prevent falls. What can I do on the outside of my home? Regularly fix the edges of walkways and driveways and fix any cracks. Remove anything that might make you trip as you walk through a door, such as a raised step or threshold. Trim any bushes or trees on the path to your home. Use bright outdoor lighting. Clear any walking paths of anything that might make someone trip, such as rocks or tools. Regularly check to see if handrails are loose or broken. Make sure that both sides of any steps have handrails. Any raised decks and porches should have guardrails on the edges. Have any leaves, snow, or ice cleared regularly. Use sand or salt on walking paths during winter. Clean up any spills in your garage right away. This includes oil or grease spills. What can I do in the bathroom? Use night lights. Install grab bars by the toilet and in the tub and shower. Do not use towel bars as grab bars. Use non-skid mats or decals in the tub or shower. If you need to sit down in the shower, use a plastic, non-slip stool. Keep the floor  dry. Clean up any water that spills on the floor as soon as it happens. Remove soap buildup in the tub or shower regularly. Attach bath mats securely with double-sided non-slip rug tape. Do not have throw rugs and other things on the floor that can make you trip. What can I do in the bedroom? Use night lights. Make sure that you have a light by your bed that is easy to reach. Do not use any sheets or blankets that are too big for your bed. They should not hang down onto the floor. Have a firm chair that has side arms. You can use this for support while you get dressed. Do not have throw rugs and other things on the floor that can make you trip. What can I do in the kitchen? Clean up any spills right away. Avoid walking on wet floors. Keep items that you use a lot in easy-to-reach places. If you need to reach something above you, use a strong step stool that has a grab bar. Keep electrical cords out of the way. Do not use floor polish or wax that makes floors slippery. If you must use wax, use non-skid floor wax. Do not have  throw rugs and other things on the floor that can make you trip. What can I do with my stairs? Do not leave any items on the stairs. Make sure that there are handrails on both sides of the stairs and use them. Fix handrails that are broken or loose. Make sure that handrails are as long as the stairways. Check any carpeting to make sure that it is firmly attached to the stairs. Fix any carpet that is loose or worn. Avoid having throw rugs at the top or bottom of the stairs. If you do have throw rugs, attach them to the floor with carpet tape. Make sure that you have a light switch at the top of the stairs and the bottom of the stairs. If you do not have them, ask someone to add them for you. What else can I do to help prevent falls? Wear shoes that: Do not have high heels. Have rubber bottoms. Are comfortable and fit you well. Are closed at the toe. Do not wear  sandals. If you use a stepladder: Make sure that it is fully opened. Do not climb a closed stepladder. Make sure that both sides of the stepladder are locked into place. Ask someone to hold it for you, if possible. Clearly mark and make sure that you can see: Any grab bars or handrails. First and last steps. Where the edge of each step is. Use tools that help you move around (mobility aids) if they are needed. These include: Canes. Walkers. Scooters. Crutches. Turn on the lights when you go into a dark area. Replace any light bulbs as soon as they burn out. Set up your furniture so you have a clear path. Avoid moving your furniture around. If any of your floors are uneven, fix them. If there are any pets around you, be aware of where they are. Review your medicines with your doctor. Some medicines can make you feel dizzy. This can increase your chance of falling. Ask your doctor what other things that you can do to help prevent falls. This information is not intended to replace advice given to you by your health care provider. Make sure you discuss any questions you have with your health care provider. Document Released: 03/23/2009 Document Revised: 11/02/2015 Document Reviewed: 07/01/2014 Elsevier Interactive Patient Education  2017 [employer].

## 2022-07-14 NOTE — Progress Notes (Unsigned)
Subjective:   Adam Reeves is a 67 y.o. male who presents for an Initial Medicare Annual Wellness Visit.  Review of Systems    ***       Objective:    There were no vitals filed for this visit. There is no height or weight on file to calculate BMI.     12/25/2015    2:04 PM 08/26/2014   11:05 PM  Advanced Directives  Does Patient Have a Medical Advance Directive? No No  Would patient like information on creating a medical advance directive? No - patient declined information     Current Medications (verified) Outpatient Encounter Medications as of 07/15/2022  Medication Sig   atorvastatin (LIPITOR) 40 MG tablet Take 1 tablet (40 mg total) by mouth daily.   bethanechol (URECHOLINE) 25 MG tablet Take 1 tablet (25 mg total) by mouth in the morning and at bedtime.   finasteride (PROSCAR) 5 MG tablet Take 1 tablet by mouth once daily   hydrochlorothiazide (HYDRODIURIL) 25 MG tablet Take 1 tablet (25 mg total) by mouth daily.   lisinopril (ZESTRIL) 40 MG tablet Take 1 tablet (40 mg total) by mouth daily.   silodosin (RAPAFLO) 8 MG CAPS capsule TAKE 1 CAPSULE BY MOUTH ONCE DAILY WITH BREAKFAST   No facility-administered encounter medications on file as of 07/15/2022.    Allergies (verified) Patient has no known allergies.   History: Past Medical History:  Diagnosis Date   Hypertension    Past Surgical History:  Procedure Laterality Date   BACK SURGERY     HERNIA REPAIR     No family history on file. Social History   Socioeconomic History   Marital status: Divorced    Spouse name: Not on file   Number of children: 4   Years of education: Not on file   Highest education level: Not on file  Occupational History   Not on file  Tobacco Use   Smoking status: Never   Smokeless tobacco: Never  Vaping Use   Vaping Use: Never used  Substance and Sexual Activity   Alcohol use: Yes    Comment: occasional   Drug use: No   Sexual activity: Yes  Other Topics Concern   Not  on file  Social History Narrative   Not on file   Social Determinants of Health   Financial Resource Strain: Low Risk  (09/01/2017)   Overall Financial Resource Strain (CARDIA)    Difficulty of Paying Living Expenses: Not hard at all  Food Insecurity: No Food Insecurity (09/01/2017)   Hunger Vital Sign    Worried About Running Out of Food in the Last Year: Never true    Ran Out of Food in the Last Year: Never true  Transportation Needs: No Transportation Needs (09/01/2017)   PRAPARE - Hydrologist (Medical): No    Lack of Transportation (Non-Medical): No  Physical Activity: Inactive (09/01/2017)   Exercise Vital Sign    Days of Exercise per Week: 0 days    Minutes of Exercise per Session: 0 min  Stress: No Stress Concern Present (09/01/2017)   [place]    Feeling of Stress : Not at all  Social Connections: Moderately Isolated (09/01/2017)   Social Connection and Isolation Panel [NHANES]    Frequency of Communication with Friends and Family: Once a week    Frequency of Social Gatherings with Friends and Family: Once a week    Attends  Religious Services: 1 to 4 times per year    Active Member of Clubs or Organizations: No    Attends Archivist Meetings: Never    Marital Status: Divorced    Tobacco Counseling Counseling given: Not Answered   Clinical Intake:                 Diabetic?No          Activities of Daily Living     No data to display          Patient Care Team: Sharion Balloon, FNP as PCP - General (Family Medicine)  Indicate any recent Medical Services you may have received from other than [facility] providers in the past year (date may be approximate).     Assessment:   This is a routine wellness examination for Adam Reeves.  Hearing/Vision screen No results found.  Dietary issues and exercise activities discussed:     Goals Addressed    None    Depression Screen    07/05/2022    8:06 AM 12/31/2021    9:05 AM 12/29/2020   10:09 AM 06/30/2020   11:54 AM 06/16/2020    8:33 AM 11/12/2019    8:11 AM 10/25/2019    8:07 AM  PHQ 2/9 Scores  PHQ - 2 Score 0 0 0 0 0 0 0  PHQ- 9 Score 0  0        Fall Risk    07/05/2022    8:06 AM 07/05/2022    8:03 AM 12/31/2021    9:05 AM 12/29/2020   10:09 AM 06/30/2020   11:54 AM  Fall Risk   Falls in the past year? 0 0 0 0 0  Number falls in past yr:  0 0    Injury with Fall?  0 0    Risk for fall due to :  No Fall Risks   No Fall Risks  Follow up  Falls evaluation completed Falls evaluation completed  Falls evaluation completed    [employer]:  Any stairs in or around the home? {YES/NO:21197} If so, are there any without handrails? {YES/NO:21197} Home free of loose throw rugs in walkways, pet beds, electrical cords, etc? {YES/NO:21197} Adequate lighting in your home to reduce risk of falls? {YES/NO:21197}  ASSISTIVE DEVICES UTILIZED TO PREVENT FALLS:  Life alert? {YES/NO:21197} Use of a cane, walker or w/c? {YES/NO:21197} Grab bars in the bathroom? {YES/NO:21197} Shower chair or bench in shower? {YES/NO:21197} Elevated toilet seat or a handicapped toilet? {YES/NO:21197}  TIMED UP AND GO:  Was the test performed? No . Telephonic visit   Cognitive Function:        Immunizations Immunization History  Administered Date(s) Administered   Influenza-Unspecified 02/14/2000   Tdap 04/28/1995    TDAP status: Due, Education has been provided regarding the importance of this vaccine. Advised may receive this vaccine at local pharmacy or Health Dept. Aware to provide a copy of the vaccination record if obtained from local pharmacy or Health Dept. Verbalized acceptance and understanding.  Flu Vaccine status: Declined, Education has been provided regarding the importance of this vaccine but patient still declined. Advised may receive this vaccine at  local pharmacy or Health Dept. Aware to provide a copy of the vaccination record if obtained from local pharmacy or Health Dept. Verbalized acceptance and understanding.  Pneumococcal vaccine status: Declined,  Education has been provided regarding the importance of this vaccine but patient still declined. Advised may receive this vaccine  at local pharmacy or Health Dept. Aware to provide a copy of the vaccination record if obtained from local pharmacy or Health Dept. Verbalized acceptance and understanding.   Covid-19 vaccine status: Information provided on how to obtain vaccines.   Qualifies for Shingles Vaccine? Yes   Zostavax completed No   Shingrix Completed?: No.    Education has been provided regarding the importance of this vaccine. Patient has been advised to call insurance company to determine out of pocket expense if they have not yet received this vaccine. Advised may also receive vaccine at local pharmacy or Health Dept. Verbalized acceptance and understanding.  Screening Tests Health Maintenance  Topic Date Due   Medicare Annual Wellness (AWV)  Never done   DTaP/Tdap/Td (2 - Td or Tdap) 04/27/2005   COVID-19 Vaccine (1) 07/21/2022 (Originally 08/08/1956)   INFLUENZA VACCINE  09/08/2022 (Originally 01/08/2022)   Zoster Vaccines- Shingrix (1 of 2) 10/04/2022 (Originally 02/04/2005)   Pneumonia Vaccine 69+ Years old (1 - PCV) 01/01/2023 (Originally 02/05/2020)   Fecal DNA (Cologuard)  10/06/2023   Hepatitis C Screening  Completed   HPV VACCINES  Aged Out    Health Maintenance  Health Maintenance Due  Topic Date Due   Medicare Annual Wellness (AWV)  Never done   DTaP/Tdap/Td (2 - Td or Tdap) 04/27/2005    Colorectal cancer screening: Type of screening: Cologuard. Completed 10/05/20. Repeat every 3 years  Lung Cancer Screening: (Low Dose CT Chest recommended if Age 73-80 years, 30 pack-year currently smoking OR have quit w/in 15years.) does not qualify.   Lung Cancer Screening  Referral: n/a   Additional Screening:  Hepatitis C Screening: does qualify; Completed 09/01/17  Vision Screening: Recommended annual ophthalmology exams for early detection of glaucoma and other disorders of the eye. Is the patient up to date with their annual eye exam?  {YES/NO:21197} Who is the provider or what is the name of the office in which the patient attends annual eye exams? *** If pt is not established with a provider, would they like to be referred to a provider to establish care? {YES/NO:21197}.   Dental Screening: Recommended annual dental exams for proper oral hygiene  Community Resource Referral / Chronic Care Management: CRR required this visit?  {YES/NO:21197}  CCM required this visit?  {YES/NO:21197}     Plan:     I have personally reviewed and noted the following in the patient's chart:   Medical and social history Use of alcohol, tobacco or illicit drugs  Current medications and supplements including opioid prescriptions. {Opioid Prescriptions:[phone]} Functional ability and status Nutritional status Physical activity Advanced directives List of other physicians Hospitalizations, surgeries, and ER visits in previous 12 months Vitals Screenings to include cognitive, depression, and falls Referrals and appointments  In addition, I have reviewed and discussed with patient certain preventive protocols, quality metrics, and best practice recommendations. A written personalized care plan for preventive services as well as general preventive health recommendations were provided to patient.     Vanetta Mulders, [place]   01/13/7618   Due to this being a virtual visit, the after visit summary with patients personalized plan was offered to patient via mail or my-chart. ***Patient declined at this time./ Patient would like to access on my-chart/ per request, patient was mailed a copy of AVS./ Patient preferred to pick up at office at next visit   Nurse  Notes: ***

## 2022-07-15 ENCOUNTER — Ambulatory Visit (INDEPENDENT_AMBULATORY_CARE_PROVIDER_SITE_OTHER): Payer: PRIVATE HEALTH INSURANCE

## 2022-07-15 VITALS — Ht >= 80 in | Wt 244.0 lb

## 2022-07-15 DIAGNOSIS — Z0000 Encounter for general adult medical examination without abnormal findings: Secondary | ICD-10-CM | POA: Diagnosis not present

## 2022-07-28 IMAGING — MR MR PROSTATE WO/W CM
12 series · 48 of 48 positions shown · IV contrast (MULTIHANCE)
Comparison: None Available.

CLINICAL DATA: Elevated PSA.

EXAM:
MR PROSTATE WITHOUT AND WITH CONTRAST
TECHNIQUE: Multiplanar multisequence MRI images were obtained of the pelvis
centered about the prostate. Pre and post contrast images were
obtained.
CONTRAST:  20mL MULTIHANCE GADOBENATE DIMEGLUMINE 529 MG/ML IV SOLN

[Series 3: T2 · coronal · 3.0mm · 0.56mm/px · 1 of 28 slices shown (1 of 3)]
[im 1/28]
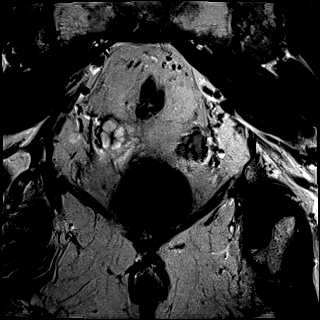

[Series 4: T1 · axial · 5.0mm · 1.25mm/px · 1 of 96 slices shown]
[im 1/96]
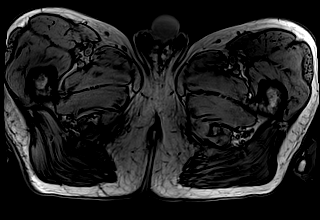

[Series 5: DWI · axial · 3.0mm · 1.75mm/px · z∈[-29,+64]mm · 2 of 96 slices shown (1 of 3)]
[im 1/96]
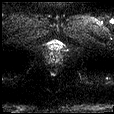
[im 96/96]
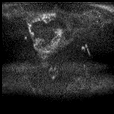

[Series 6: DWI · axial · 3.0mm · 1.75mm/px · 1 of 32 slices shown (2 of 3)]
[im 1/32]
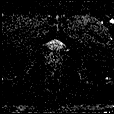

[Series 7: DWI · axial · 3.0mm · 1.75mm/px · 1 of 32 slices shown (3 of 3)]
[im 1/32]
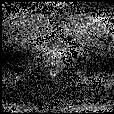

[Series 8: T2 · axial · 3.0mm · 0.56mm/px · 1 of 32 slices shown (2 of 3)]
[im 1/32]
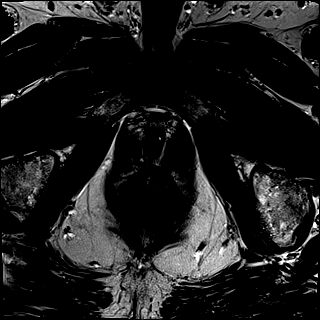

[Series 9: T2 · axial · 1.0mm · 1.04mm/px · z∈[-34,+69]mm · 2 of 104 slices shown (3 of 3)]
[im 1/104]
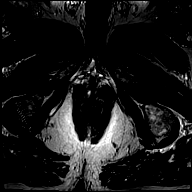
[im 104/104]
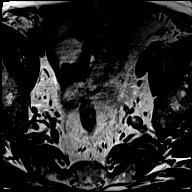

[Series 10: pre t1_twist_tra_dyn · axial · non-contrast · 3.5mm · 0.83mm/px · 1 of 26 slices shown]
[im 1/26]
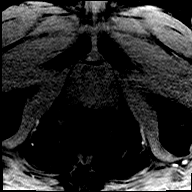

[Series 11: post t1_twist_tra_dyn-copy center · axial · non-contrast · 3.5mm · 0.83mm/px · z∈[-26,+61]mm · 17 of 780 slices shown]
[im 1/780]
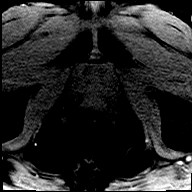
[im 49/780]
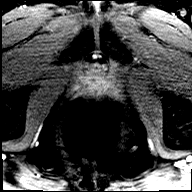
[im 98/780]
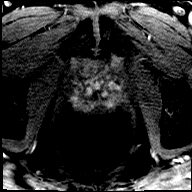
[im 147/780]
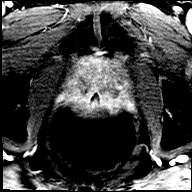
[im 195/780]
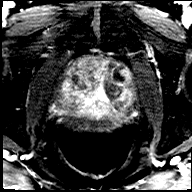
[im 244/780]
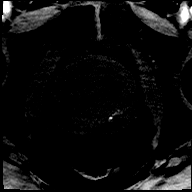
[im 293/780]
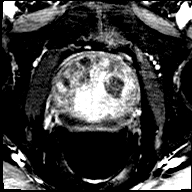
[im 341/780]
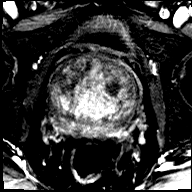
[im 390/780]
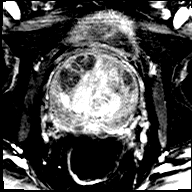
[im 439/780]
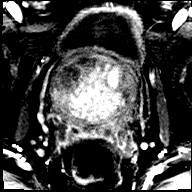
[im 487/780]
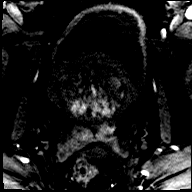
[im 536/780]
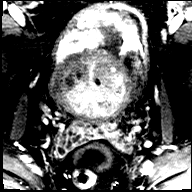
[im 585/780]
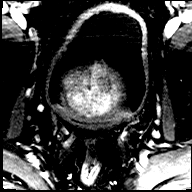
[im 633/780]
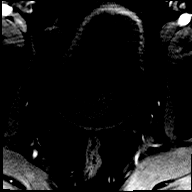
[im 682/780]
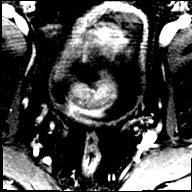
[im 731/780]
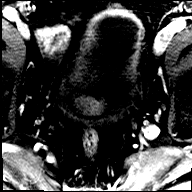
[im 780/780]
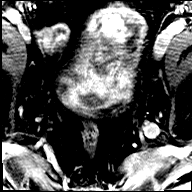

[Series 12: post t1_twist_tra_dyn-copy cent_sub · axial · 3.5mm · 0.83mm/px · z∈[-26,+61]mm · 17 of 754 slices shown]
[im 1/754]
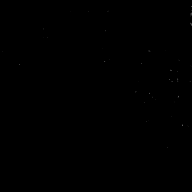
[im 48/754]
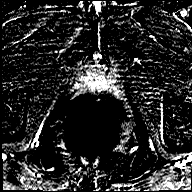
[im 95/754]
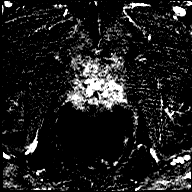
[im 142/754]
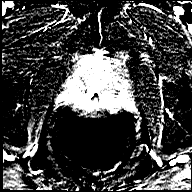
[im 189/754]
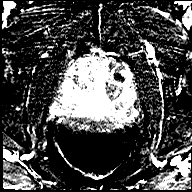
[im 236/754]
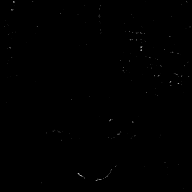
[im 283/754]
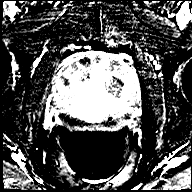
[im 330/754]
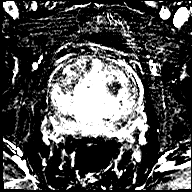
[im 377/754]
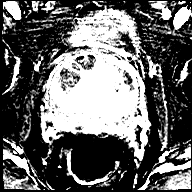
[im 424/754]
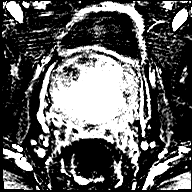
[im 471/754]
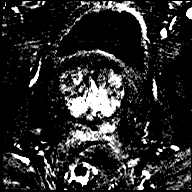
[im 518/754]
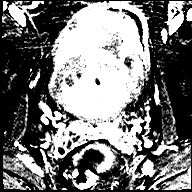
[im 565/754]
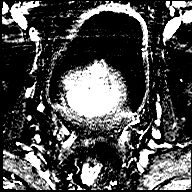
[im 612/754]
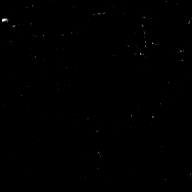
[im 659/754]
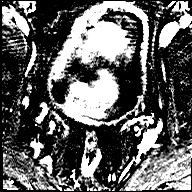
[im 706/754]
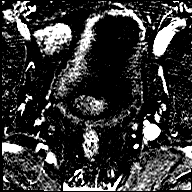
[im 754/754]
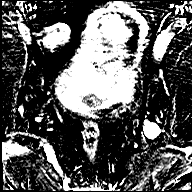

[Series 13: t1_vibe_dixon_tra_f · axial · 2.5mm · 0.91mm/px · z∈[-69,+168]mm · 2 of 96 slices shown]
[im 1/96]
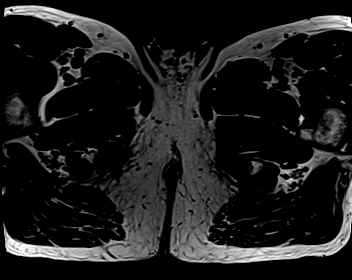
[im 96/96]
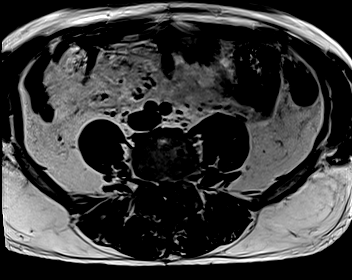

[Series 14: t1_vibe_dixon_tra_w · axial · 2.5mm · 0.91mm/px · z∈[-69,+168]mm · 2 of 96 slices shown]
[im 1/96]
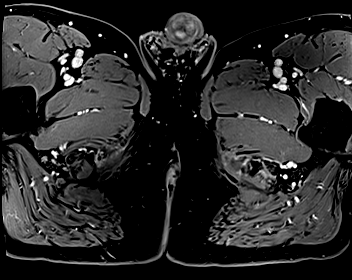
[im 96/96]
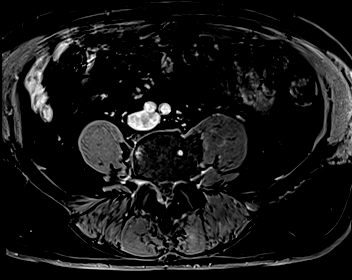

[48 of 48 positions shown; findings below may reference images not displayed]

FINDINGS: Prostate:

-- Peripheral Zone: No focal lesion seen on ADC or high b-value DWI
sequences.

-- Transition/Central Zone: Markedly enlarged with multiple BPH
nodules. No nodules with suspicious characteristics on T2-weighted
imaging.

-- Measurements/Volume:  8.7 by 6.6 x 7.5 cm (volume = 230 cm^3)

Transcapsular spread:  Absent

Seminal vesicle involvement:  Absent

Neurovascular bundle involvement:  Absent

Pelvic adenopathy: None visualized

Bone metastasis: None visualized

Other: Diffuse bladder wall thickening, consistent with chronic
bladder outlet obstruction. Sigmoid diverticulosis, without evidence
of diverticulitis.
IMPRESSION: No radiographic evidence of high-grade prostate carcinoma. PI-RADS 1
(v2.1): Very Low (clinically significant cancer highly unlikely)

## 2022-10-14 ENCOUNTER — Other Ambulatory Visit: Payer: Self-pay | Admitting: Family

## 2022-10-14 DIAGNOSIS — N400 Enlarged prostate without lower urinary tract symptoms: Secondary | ICD-10-CM

## 2022-12-23 ENCOUNTER — Ambulatory Visit: Payer: Medicare Other | Admitting: Urology

## 2023-01-06 ENCOUNTER — Encounter: Payer: Self-pay | Admitting: Family

## 2023-01-06 ENCOUNTER — Ambulatory Visit (INDEPENDENT_AMBULATORY_CARE_PROVIDER_SITE_OTHER): Payer: PRIVATE HEALTH INSURANCE | Admitting: Family

## 2023-01-06 VITALS — BP 130/78 | HR 67 | Temp 97.9°F | Ht >= 80 in | Wt 242.8 lb

## 2023-01-06 DIAGNOSIS — N138 Other obstructive and reflux uropathy: Secondary | ICD-10-CM

## 2023-01-06 DIAGNOSIS — N400 Enlarged prostate without lower urinary tract symptoms: Secondary | ICD-10-CM | POA: Diagnosis not present

## 2023-01-06 DIAGNOSIS — I10 Essential (primary) hypertension: Secondary | ICD-10-CM

## 2023-01-06 DIAGNOSIS — R972 Elevated prostate specific antigen [PSA]: Secondary | ICD-10-CM

## 2023-01-06 DIAGNOSIS — E782 Mixed hyperlipidemia: Secondary | ICD-10-CM | POA: Diagnosis not present

## 2023-01-06 DIAGNOSIS — E663 Overweight: Secondary | ICD-10-CM

## 2023-01-06 DIAGNOSIS — N401 Enlarged prostate with lower urinary tract symptoms: Secondary | ICD-10-CM

## 2023-01-06 LAB — CMP14+EGFR
ALT: 32 IU/L (ref 0–44)
AST: 25 IU/L (ref 0–40)
Albumin: 4.5 g/dL (ref 3.9–4.9)
Alkaline Phosphatase: 116 IU/L (ref 44–121)
BUN/Creatinine Ratio: 13 (ref 10–24)
BUN: 13 mg/dL (ref 8–27)
Bilirubin Total: 0.5 mg/dL (ref 0.0–1.2)
CO2: 23 mmol/L (ref 20–29)
Calcium: 9.1 mg/dL (ref 8.6–10.2)
Chloride: 104 mmol/L (ref 96–106)
Creatinine, Ser: 1.01 mg/dL (ref 0.76–1.27)
Globulin, Total: 2.4 g/dL (ref 1.5–4.5)
Glucose: 91 mg/dL (ref 70–99)
Potassium: 3.7 mmol/L (ref 3.5–5.2)
Sodium: 140 mmol/L (ref 134–144)
Total Protein: 6.9 g/dL (ref 6.0–8.5)
eGFR: 82 mL/min/{1.73_m2} (ref >59)

## 2023-01-06 LAB — PSA, TOTAL AND FREE

## 2023-01-06 LAB — CBC WITH DIFFERENTIAL/PLATELET
Basophils Absolute: 0 10*3/uL (ref 0.0–0.2)
Basos: 1 %
EOS (ABSOLUTE): 0.2 10*3/uL (ref 0.0–0.4)
Eos: 4 %
Hematocrit: 41.7 % (ref 37.5–51.0)
Hemoglobin: 14.2 g/dL (ref 13.0–17.7)
Immature Grans (Abs): 0 10*3/uL (ref 0.0–0.1)
Immature Granulocytes: 1 %
Lymphocytes Absolute: 1.4 10*3/uL (ref 0.7–3.1)
Lymphs: 26 %
MCH: 29.5 pg (ref 26.6–33.0)
MCHC: 34.1 g/dL (ref 31.5–35.7)
MCV: 87 fL (ref 79–97)
Monocytes Absolute: 0.6 10*3/uL (ref 0.1–0.9)
Monocytes: 11 %
Neutrophils Absolute: 3 10*3/uL (ref 1.4–7.0)
Neutrophils: 57 %
Platelets: 200 10*3/uL (ref 150–450)
RBC: 4.82 x10E6/uL (ref 4.14–5.80)
RDW: 13 % (ref 11.6–15.4)
WBC: 5.2 10*3/uL (ref 3.4–10.8)

## 2023-01-06 LAB — LIPID PANEL
Chol/HDL Ratio: 4.1 ratio (ref 0.0–5.0)
Cholesterol, Total: 141 mg/dL (ref 100–199)
HDL: 34 mg/dL — ABNORMAL LOW (ref >39)
LDL Chol Calc (NIH): 82 mg/dL (ref 0–99)
Triglycerides: 144 mg/dL (ref 0–149)
VLDL Cholesterol Cal: 25 mg/dL (ref 5–40)

## 2023-01-06 MED ORDER — FINASTERIDE 5 MG PO TABS: 5.0000 mg | ORAL_TABLET | Freq: Every day | ORAL | 0 refills | Status: DC

## 2023-01-06 MED ORDER — ATORVASTATIN CALCIUM 40 MG PO TABS: 40.0000 mg | ORAL_TABLET | Freq: Every day | ORAL | 4 refills | Status: DC

## 2023-01-06 MED ORDER — HYDROCHLOROTHIAZIDE 25 MG PO TABS: 25.0000 mg | ORAL_TABLET | Freq: Every day | ORAL | 4 refills | Status: DC

## 2023-01-06 MED ORDER — LISINOPRIL 40 MG PO TABS: 40.0000 mg | ORAL_TABLET | Freq: Every day | ORAL | 4 refills | Status: DC

## 2023-01-06 NOTE — Patient Instructions (Signed)
Hypertension, Adult High blood pressure (hypertension) is when the force of blood pumping through the arteries is too strong. The arteries are the blood vessels that carry blood from the heart throughout the body. Hypertension forces the heart to work harder to pump blood and may cause arteries to become narrow or stiff. Untreated or uncontrolled hypertension can lead to a heart attack, heart failure, a stroke, kidney disease, and other problems. A blood pressure reading consists of a higher number over a lower number. Ideally, your blood pressure should be below 120/80. The first ("top") number is called the systolic pressure. It is a measure of the pressure in your arteries as your heart beats. The second ("bottom") number is called the diastolic pressure. It is a measure of the pressure in your arteries as the heart relaxes. What are the causes? The exact cause of this condition is not known. There are some conditions that result in high blood pressure. What increases the risk? Certain factors may make you more likely to develop high blood pressure. Some of these risk factors are under your control, including: Smoking. Not getting enough exercise or physical activity. Being overweight. Having too much fat, sugar, calories, or salt (sodium) in your diet. Drinking too much alcohol. Other risk factors include: Having a personal history of heart disease, diabetes, high cholesterol, or kidney disease. Stress. Having a family history of high blood pressure and high cholesterol. Having obstructive sleep apnea. Age. The risk increases with age. What are the signs or symptoms? High blood pressure may not cause symptoms. Very high blood pressure (hypertensive crisis) may cause: Headache. Fast or irregular heartbeats (palpitations). Shortness of breath. Nosebleed. Nausea and vomiting. Vision changes. Severe chest pain, dizziness, and seizures. How is this diagnosed? This condition is diagnosed by  measuring your blood pressure while you are seated, with your arm resting on a flat surface, your legs uncrossed, and your feet flat on the floor. The cuff of the blood pressure monitor will be placed directly against the skin of your upper arm at the level of your heart. Blood pressure should be measured at least twice using the same arm. Certain conditions can cause a difference in blood pressure between your right and left arms. If you have a high blood pressure reading during one visit or you have normal blood pressure with other risk factors, you may be asked to: Return on a different day to have your blood pressure checked again. Monitor your blood pressure at home for 1 week or longer. If you are diagnosed with hypertension, you may have other blood or imaging tests to help your health care provider understand your overall risk for other conditions. How is this treated? This condition is treated by making healthy lifestyle changes, such as eating healthy foods, exercising more, and reducing your alcohol intake. You may be referred for counseling on a healthy diet and physical activity. Your health care provider may prescribe medicine if lifestyle changes are not enough to get your blood pressure under control and if: Your systolic blood pressure is above 130. Your diastolic blood pressure is above 80. Your personal target blood pressure may vary depending on your medical conditions, your age, and other factors. Follow these instructions at home: Eating and drinking  Eat a diet that is high in fiber and potassium, and low in sodium, added sugar, and fat. An example of this eating plan is called the DASH diet. DASH stands for Dietary Approaches to Stop Hypertension. To eat this way: Eat   plenty of fresh fruits and vegetables. Try to fill one half of your plate at each meal with fruits and vegetables. Eat whole grains, such as whole-wheat pasta, brown rice, or whole-grain bread. Fill about one  fourth of your plate with whole grains. Eat or drink low-fat dairy products, such as skim milk or low-fat yogurt. Avoid fatty cuts of meat, processed or cured meats, and poultry with skin. Fill about one fourth of your plate with lean proteins, such as fish, chicken without skin, beans, eggs, or tofu. Avoid pre-made and processed foods. These tend to be higher in sodium, added sugar, and fat. Reduce your daily sodium intake. Many people with hypertension should eat less than 1,500 mg of sodium a day. Do not drink alcohol if: Your health care provider tells you not to drink. You are pregnant, may be pregnant, or are planning to become pregnant. If you drink alcohol: Limit how much you have to: 0-1 drink a day for women. 0-2 drinks a day for men. Know how much alcohol is in your drink. In the [place] one drink equals one 12 oz bottle of beer (355 mL), one 5 oz glass of wine (148 mL), or one 1 oz glass of hard liquor (44 mL). Lifestyle  Work with your health care provider to maintain a healthy body weight or to lose weight. Ask what an ideal weight is for you. Get at least 30 minutes of exercise that causes your heart to beat faster (aerobic exercise) most days of the week. Activities may include walking, swimming, or biking. Include exercise to strengthen your muscles (resistance exercise), such as Pilates or lifting weights, as part of your weekly exercise routine. Try to do these types of exercises for 30 minutes at least 3 days a week. Do not use any products that contain nicotine or tobacco. These products include cigarettes, chewing tobacco, and vaping devices, such as e-cigarettes. If you need help quitting, ask your health care provider. Monitor your blood pressure at home as told by your health care provider. Keep all follow-up visits. This is important. Medicines Take over-the-counter and prescription medicines only as told by your health care provider. Follow directions carefully. Blood  pressure medicines must be taken as prescribed. Do not skip doses of blood pressure medicine. Doing this puts you at risk for problems and can make the medicine less effective. Ask your health care provider about side effects or reactions to medicines that you should watch for. Contact a health care provider if you: Think you are having a reaction to a medicine you are taking. Have headaches that keep coming back (recurring). Feel dizzy. Have swelling in your ankles. Have trouble with your vision. Get help right away if you: Develop a severe headache or confusion. Have unusual weakness or numbness. Feel faint. Have severe pain in your chest or abdomen. Vomit repeatedly. Have trouble breathing. These symptoms may be an emergency. Get help right away. Call 911. Do not wait to see if the symptoms will go away. Do not drive yourself to the hospital. Summary Hypertension is when the force of blood pumping through your arteries is too strong. If this condition is not controlled, it may put you at risk for serious complications. Your personal target blood pressure may vary depending on your medical conditions, your age, and other factors. For most people, a normal blood pressure is less than 120/80. Hypertension is treated with lifestyle changes, medicines, or a combination of both. Lifestyle changes include losing weight, eating a healthy,   low-sodium diet, exercising more, and limiting alcohol. This information is not intended to replace advice given to you by your health care provider. Make sure you discuss any questions you have with your health care provider. Document Revised: 04/03/2021 Document Reviewed: 04/03/2021 Elsevier Patient Education  2024 [employer] Inc.  

## 2023-01-06 NOTE — Progress Notes (Signed)
Subjective:    Patient ID: Adam Reeves, male    DOB: 1955-08-11, 67 y.o.   MRN: [id number]  PT presents to the office today for chronic follow up. He is followed by Urologists every 6 months for BPH and elevated PSA.   Hypertension This is a chronic problem. The current episode started more than 1 year ago. The problem has been waxing and waning since onset. The problem is uncontrolled. Pertinent negatives include no malaise/fatigue, peripheral edema or shortness of breath. Risk factors for coronary artery disease include dyslipidemia, male gender and sedentary lifestyle. The current treatment provides moderate improvement.  Benign Prostatic Hypertrophy This is a chronic problem. The current episode started more than 1 year ago. Irritative symptoms include nocturia (improved). Past treatments include finasteride. The treatment provided moderate relief.  Hyperlipidemia This is a chronic problem. The current episode started more than 1 year ago. The problem is controlled. Recent lipid tests were reviewed and are normal. Pertinent negatives include no shortness of breath. Current antihyperlipidemic treatment includes statins. The current treatment provides moderate improvement of lipids. Risk factors for coronary artery disease include dyslipidemia, hypertension, male sex and a sedentary lifestyle.      Review of Systems  Constitutional:  Negative for malaise/fatigue.  Respiratory:  Negative for shortness of breath.   Genitourinary:  Positive for nocturia (improved).  All other systems reviewed and are negative.      Objective:   Physical Exam Vitals reviewed.  Constitutional:      General: He is not in acute distress.    Appearance: He is well-developed.  HENT:     Head: Normocephalic.     Right Ear: Tympanic membrane normal.     Left Ear: Tympanic membrane normal.     Ears:     Comments: HOH, hearing aid in place Eyes:     General:        Right eye: No discharge.        Left  eye: No discharge.     Pupils: Pupils are equal, round, and reactive to light.  Neck:     Thyroid: No thyromegaly.  Cardiovascular:     Rate and Rhythm: Normal rate and regular rhythm.     Heart sounds: Normal heart sounds. No murmur heard. Pulmonary:     Effort: Pulmonary effort is normal. No respiratory distress.     Breath sounds: Normal breath sounds. No wheezing.  Abdominal:     General: Bowel sounds are normal. There is no distension.     Palpations: Abdomen is soft.     Tenderness: There is no abdominal tenderness.  Musculoskeletal:        General: No tenderness. Normal range of motion.     Cervical back: Normal range of motion and neck supple.  Skin:    General: Skin is warm and dry.     Findings: No erythema or rash.  Neurological:     Mental Status: He is alert and oriented to person, place, and time.     Cranial Nerves: No cranial nerve deficit.     Deep Tendon Reflexes: Reflexes are normal and symmetric.  Psychiatric:        Behavior: Behavior normal.        Thought Content: Thought content normal.        Judgment: Judgment normal.       BP 130/78   Pulse 67   Temp 97.9 F (36.6 C) (Temporal)   Ht 6\' 8"  (2.032 m)   Wt  242 lb 12.8 oz (110.1 kg)   SpO2 98%   BMI 26.67 kg/m      Assessment & Plan:  Adam Reeves comes in today with chief complaint of Medical Management of Chronic Issues (No concerns not fasting. Patient declines all vaccines today ')   Diagnosis and orders addressed:  1. Mixed hyperlipidemia - atorvastatin (LIPITOR) 40 MG tablet; Take 1 tablet (40 mg total) by mouth daily.  Dispense: 90 tablet; Refill: 4 - CBC with Differential/Platelet - CMP14+EGFR - Lipid panel  2. BPH with elevated PSA - finasteride (PROSCAR) 5 MG tablet; Take 1 tablet (5 mg total) by mouth daily.  Dispense: 90 tablet; Refill: 0 - CBC with Differential/Platelet - CMP14+EGFR  3. Essential (primary) hypertension - hydrochlorothiazide (HYDRODIURIL) 25 MG tablet;  Take 1 tablet (25 mg total) by mouth daily.  Dispense: 90 tablet; Refill: 4 - lisinopril (ZESTRIL) 40 MG tablet; Take 1 tablet (40 mg total) by mouth daily.  Dispense: 90 tablet; Refill: 4 - CBC with Differential/Platelet - CMP14+EGFR  4. BPH with obstruction/lower urinary tract symptoms - CBC with Differential/Platelet - CMP14+EGFR - PSA, total and free  5. Overweight (BMI 25.0-29.9) - CBC with Differential/Platelet - CMP14+EGFR   Labs pending Health Maintenance reviewed Diet and exercise encouraged  Follow up plan: 6 months    Jannifer Rodney, FNP

## 2023-01-27 ENCOUNTER — Encounter: Payer: Self-pay | Admitting: Urology

## 2023-01-27 ENCOUNTER — Ambulatory Visit (INDEPENDENT_AMBULATORY_CARE_PROVIDER_SITE_OTHER): Payer: No Typology Code available for payment source | Admitting: Urology

## 2023-01-27 VITALS — BP 161/84 | HR 77

## 2023-01-27 DIAGNOSIS — R339 Retention of urine, unspecified: Secondary | ICD-10-CM | POA: Diagnosis not present

## 2023-01-27 DIAGNOSIS — Z87898 Personal history of other specified conditions: Secondary | ICD-10-CM

## 2023-01-27 DIAGNOSIS — R972 Elevated prostate specific antigen [PSA]: Secondary | ICD-10-CM | POA: Diagnosis not present

## 2023-01-27 DIAGNOSIS — N401 Enlarged prostate with lower urinary tract symptoms: Secondary | ICD-10-CM | POA: Diagnosis not present

## 2023-01-27 DIAGNOSIS — N138 Other obstructive and reflux uropathy: Secondary | ICD-10-CM

## 2023-01-27 DIAGNOSIS — N400 Enlarged prostate without lower urinary tract symptoms: Secondary | ICD-10-CM

## 2023-01-27 LAB — URINALYSIS, ROUTINE W REFLEX MICROSCOPIC
Bilirubin, UA: NEGATIVE
Glucose, UA: NEGATIVE
Ketones, UA: NEGATIVE
Leukocytes,UA: NEGATIVE
Nitrite, UA: NEGATIVE
Protein,UA: NEGATIVE
RBC, UA: NEGATIVE
Specific Gravity, UA: 1.025 (ref 1.005–1.030)
Urobilinogen, Ur: 0.2 mg/dL (ref 0.2–1.0)
pH, UA: 6 (ref 5.0–7.5)

## 2023-01-27 MED ORDER — FINASTERIDE 5 MG PO TABS: 5.0000 mg | ORAL_TABLET | Freq: Every day | ORAL | 3 refills | Status: DC

## 2023-01-27 MED ORDER — TAMSULOSIN HCL 0.4 MG PO CAPS: 0.4000 mg | ORAL_CAPSULE | Freq: Every day | ORAL | 3 refills | Status: DC

## 2023-01-27 NOTE — Progress Notes (Unsigned)
01/27/2023 2:40 PM   Adam Reeves May 04, 1956 [id number]  Referring provider: Junie Spencer, FNP [address] [place],  [place] [address]  No chief complaint on file.   HPI:  F/u -    1) BPH - presented with urinary retention 10/22. His prostate was 230 g on MRI 05/23. He was on silodosin but ran out and on finasteride. He tried CIC and failed. CT benign - BPH. He passed a void trial 10/22.    F/u AUA score = 5.  PVR = 1 ml.   2) PSA elevation - his PSA in 03/19 was 8.9 with 12% free. PSA from 12/22 was 6.0 (12 corrected for finasteride) with 14.5% free.  He has not wanted to proceed with a prostate biopsy.   03/23 ExoDx test was 48.61 indicating an increased risk of high-grade prostate cancer. He underwent further evaluation with a MRI of the prostate on 05/23 which showed a prostate volume of 230 mL, no areas suspicious for high-grade prostate cancer, diffuse bladder wall thickening consistent with chronic bladder outlet obstruction.   He returns today for follow-up.  He continues on silodosin, finasteride, and Urecholine. His urinary symptoms remain stable.  He has not required catheterization.  No dysuria or gross hematuria.  IPSS = 0.  His Jul 2024 PSA was 6 (12).   UA clear.    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   PMH: Past Medical History:  Diagnosis Date   Hypertension     Surgical History: Past Surgical History:  Procedure Laterality Date   BACK SURGERY     HERNIA REPAIR      Home Medications:  Allergies as of 01/27/2023   No Known Allergies      Medication List        Accurate as of January 27, 2023  2:40 PM. If you have any questions, ask your nurse or doctor.          atorvastatin 40 MG tablet Commonly known as: LIPITOR Take 1 tablet (40 mg total) by mouth daily.   bethanechol 25 MG tablet Commonly known as: URECHOLINE Take 1 tablet (25 mg total) by mouth in the morning and at bedtime.   finasteride 5 MG tablet Commonly known  as: PROSCAR Take 1 tablet (5 mg total) by mouth daily.   hydrochlorothiazide 25 MG tablet Commonly known as: HYDRODIURIL Take 1 tablet (25 mg total) by mouth daily.   lisinopril 40 MG tablet Commonly known as: ZESTRIL Take 1 tablet (40 mg total) by mouth daily.   silodosin 8 MG Caps capsule Commonly known as: RAPAFLO TAKE 1 CAPSULE BY MOUTH ONCE DAILY WITH BREAKFAST        Allergies: No Known Allergies  Family History: No family history on file.  Social History:  reports that he has never smoked. He has never used smokeless tobacco. He reports current alcohol use. He reports that he does not use drugs.   Physical Exam: BP (!) 161/84   Pulse 77   Constitutional:  Alert and oriented, No acute distress. HEENT: [place] AT, moist mucus membranes.  Trachea midline, no masses. Cardiovascular: No clubbing, cyanosis, or edema. Respiratory: Normal respiratory effort, no increased work of breathing. GI: Abdomen is soft, nontender, nondistended, no abdominal masses GU: No CVA tenderness Lymph: No cervical or inguinal lymphadenopathy. Skin: No rashes, bruises or suspicious lesions. Neurologic: Grossly intact, no focal deficits, moving all 4 extremities. Psychiatric: Normal mood and affect.  Laboratory Data: Lab Results  Component Value Date   WBC 5.2 01/06/2023   HGB 14.2 01/06/2023   HCT 41.7 01/06/2023   MCV 87 01/06/2023   PLT 200 01/06/2023    Lab Results  Component Value Date   CREATININE 1.01 01/06/2023    No results found for: "PSA"  No results found for: "TESTOSTERONE"  No results found for: "HGBA1C"  Urinalysis    Component Value Date/Time   COLORURINE RED (A) 08/27/2014 0051   APPEARANCEUR Clear 06/24/2022 0952   LABSPEC 1.015 08/27/2014 0051   PHURINE 5.0 08/27/2014 0051   GLUCOSEU Negative 06/24/2022 0952   HGBUR LARGE (A) 08/27/2014 0051   BILIRUBINUR Negative 06/24/2022 0952   KETONESUR TRACE (A) 08/27/2014 0051   PROTEINUR Negative 06/24/2022 0952    PROTEINUR >300 (A) 08/27/2014 0051   UROBILINOGEN 2.0 (H) 08/27/2014 0051   NITRITE Negative 06/24/2022 0952   NITRITE POSITIVE (A) 08/27/2014 0051   LEUKOCYTESUR Negative 06/24/2022 0952    Lab Results  Component Value Date   LABMICR See below: 06/24/2022   WBCUA 0-5 06/24/2022   LABEPIT 0-10 06/24/2022   BACTERIA None seen 06/24/2022    Pertinent Imaging:  Results for orders placed in visit on 08/08/99  DG Abd 1 View  Narrative FINDINGS CLINICAL DATA:  67 YEAR OLD WHO FELL.  RIGHT HIP PAIN AND BACK PAIN. CT HIPS: AXIAL CT IMAGES WERE OBTAINED IN A HELICAL FASHION WITH 2.5 MM. COLLIMATION, AND IMAGES WERE RECONSTRUCTED IN THE CORONAL AND SAGITTAL PLANES. THE PATIENT HAS A LARGE RIGHT HIP JOINT EFFUSION.  THERE IS A QUESTIONABLE SMALL BREAK IN THE ANTERIOR AND POSTERIOR CORTEX, LOW FEMORAL NECK.  GIVEN THE LARGE JOINT EFFUSION, THIS IS SUSPICIOUS FOR AN OCCULT FRACTURE.  MRI MAY BE HELPFUL FOR FURTHER EVALUATION, IF CLINICALLY INDICATED. THERE IS A SMALL AMOUNT OF AIR JUST DEEP TO THE GLUTEUS MAXIMUS MUSCLE, NEAR THE RIGHT HIP.  IT IS POSSIBLE THIS COULD BE FROM AN IM INJECTION OF PAIN MEDICINE.  IT IS POSSIBLE THE PATIENT HAD A TRANSIENT DISLOCATION, BUT I DO NOT SEE ANY OTHER SIGNS TO SUGGEST THIS.  RECOMMEND CLINICAL CORRELATION. NO ACUTE BONY FINDINGS INVOLVING THE LEFT HIP OR THE VISUALIZED PUBIC BONES. IMPRESSION 1.  LARGE HIP JOINT EFFUSION WITH PROBABLE OCCULT LOW FEMORAL NECK FRACTURE.  MRI MAY BE HELPFUL FOR FURTHER EVALUATION OF THIS FINDING. MULTIPLANAR REFORMATTED IMAGES: SAGITTAL AND CORONAL REFORMATTED IMAGES WERE OBTAINED.  I DO NOT SEE A FRACTURE FOR SURE, BUT I AM STILL SUSPICIOUS OF A LOW FEMORAL NECK FRACTURE. IMPRESSION 1.  RIGHT HIP JOINT EFFUSION.  NO DEFINITE FRACTURE IS SEEN ON THE RECONSTRUCTED IMAGES. ABDOMEN, ONE VIEW: REPEAT ABDOMINAL FILM, TO EVALUATE THE PARASPINAL ABNORMALITY SEEN ON THE AP VIEW OF THE LUMBAR SPINE, DEMONSTRATES NO  PERSISTENT ABNORMALITY. IMPRESSION NO PERSISTENT PARASPINAL ABNORMALITY ON REPEAT ABDOMINAL FILM.  No results found for this or any previous visit.  No results found for this or any previous visit.  No results found for this or any previous visit.  No results found for this or any previous visit.  No valid procedures specified. No results found for this or any previous visit.  Results for orders placed during the hospital encounter of 08/27/14  CT Renal Stone Study  Narrative CLINICAL DATA:  Hematuria  EXAM: CT ABDOMEN AND PELVIS WITHOUT CONTRAST  TECHNIQUE: Multidetector CT imaging of the abdomen and pelvis was performed following the standard protocol without IV contrast.  COMPARISON:  08/26/2014 radiograph  FINDINGS: Normal heart size. Trace pericardial fluid. Mild bibasilar dependent atelectasis.  Organ evaluation limited without intravenous contrast. Within this limitation, no appreciable abnormality of the liver, biliary system, pancreas. Curvilinear calcification along the periphery of the spleen suggest sequelae of prior trauma or hematoma. No adrenal nodule.  Mild right greater than left perinephric fat stranding. No urinary tract calculi. No hydroureteronephrosis.  No overt colitis. Colonic diverticulosis. No CT evidence for acute diverticulitis. Normal appendix. Small bowel loops are of normal course and caliber. No free intraperitoneal air or fluid. No lymphadenopathy.  Scattered atherosclerotic disease of the aorta and branch vessels without aneurysmal dilatation.  Circumferential bladder wall thickening. Perivesicular fat stranding and ill-defined fluid. Enlarged prostate gland measuring 7 cm transverse diameter. A Foley catheter is in place within the decompressed bladder.  Multilevel degenerative changes. Grade 1 retrolisthesis of L4 on L5. Sclerotic focus proximal right femoral shaft, favored to reflect a bone island. Right posterior lateral  lower chest musculature lipoma.  IMPRESSION: Circumferential bladder wall thickening may reflect chronic partial outlet obstruction in the setting of prostatomegaly. Perivesicular fat stranding raises concern for superimposed infection or inflammation. Mild bilateral perinephric fat stranding may reflect ascending infection and/or medical renal disease. Correlate with urinalysis and creatinine.  No urinary tract calculi or hydroureteronephrosis.  Given these findings and the stated history, recommend urology follow-up and cystoscopy.   Electronically Signed By: Jearld Lesch M.D. On: 08/27/2014 02:08   Assessment & Plan:    1. BPH with obstruction/lower urinary tract symptoms Meds refilled - will try tamsulosin as it may be less expensive. Cont finasteride.  - Urinalysis, Routine w reflex microscopic  2. Elevated PSA PSA remains stable and PSAD normal.  - Urinalysis, Routine w reflex microscopic  3. History of urinary retention Voiding well  - Urinalysis, Routine w reflex microscopic   No follow-ups on file.  Jerilee Field, MD  [facility]  [address] [place], [place] [address] [phone]

## 2023-07-07 ENCOUNTER — Ambulatory Visit: Payer: Medicare Other | Admitting: Family

## 2023-07-21 ENCOUNTER — Ambulatory Visit (INDEPENDENT_AMBULATORY_CARE_PROVIDER_SITE_OTHER): Payer: Medicare Other

## 2023-07-21 VITALS — Ht >= 80 in | Wt 242.0 lb

## 2023-07-21 DIAGNOSIS — Z0000 Encounter for general adult medical examination without abnormal findings: Secondary | ICD-10-CM

## 2023-07-21 NOTE — Progress Notes (Signed)
 Subjective:   Adam Reeves is a 68 y.o. male who presents for Medicare Annual/Subsequent preventive examination.  Visit Complete: Virtual I connected with  Adam Reeves on 07/21/23 by a video and audio enabled telemedicine application and verified that I am speaking with the correct person using two identifiers.  Patient Location: Home  Provider Location: Home Office  This patient declined Interactive audio and video telecommunications. Therefore the visit was completed with audio only.  I discussed the limitations of evaluation and management by telemedicine. The patient expressed understanding and agreed to proceed.  Vital Signs: Because this visit was a virtual/telehealth visit, some criteria may be missing or patient reported. Any vitals not documented were not able to be obtained and vitals that have been documented are patient reported.  Cardiac Risk Factors include: advanced age (>55men, >39 women);dyslipidemia;hypertension;male gender     Objective:    Today's Vitals   07/21/23 0936  Weight: 242 lb (109.8 kg)  Height: 6\' 8"  (2.032 m)   Body mass index is 26.59 kg/m.     07/21/2023   10:24 AM 07/15/2022    9:41 AM 12/25/2015    2:04 PM 08/26/2014   11:05 PM  Advanced Directives  Does Patient Have a Medical Advance Directive? No No No No  Would patient like information on creating a medical advance directive? Yes (MAU/Ambulatory/Procedural Areas - Information given) No - Patient declined No - patient declined information     Current Medications (verified) Outpatient Encounter Medications as of 07/21/2023  Medication Sig   atorvastatin  (LIPITOR) 40 MG tablet Take 1 tablet (40 mg total) by mouth daily.   bethanechol  (URECHOLINE ) 25 MG tablet Take 1 tablet (25 mg total) by mouth in the morning and at bedtime.   finasteride  (PROSCAR ) 5 MG tablet Take 1 tablet (5 mg total) by mouth daily.   hydrochlorothiazide  (HYDRODIURIL ) 25 MG tablet Take 1 tablet (25 mg total) by mouth  daily.   lisinopril  (ZESTRIL ) 40 MG tablet Take 1 tablet (40 mg total) by mouth daily.   tamsulosin  (FLOMAX ) 0.4 MG CAPS capsule Take 1 capsule (0.4 mg total) by mouth daily after supper.   No facility-administered encounter medications on file as of 07/21/2023.    Allergies (verified) Patient has no known allergies.   History: Past Medical History:  Diagnosis Date   Hypertension    Past Surgical History:  Procedure Laterality Date   BACK SURGERY     HERNIA REPAIR     History reviewed. No pertinent family history. Social History   Socioeconomic History   Marital status: Divorced    Spouse name: Not on file   Number of children: 4   Years of education: Not on file   Highest education level: Not on file  Occupational History   Not on file  Tobacco Use   Smoking status: Never   Smokeless tobacco: Never  Vaping Use   Vaping status: Never Used  Substance and Sexual Activity   Alcohol use: Yes    Comment: occasional   Drug use: No   Sexual activity: Yes  Other Topics Concern   Not on file  Social History Narrative   Not on file   Social Drivers of Health   Financial Resource Strain: Low Risk  (07/21/2023)   Overall Financial Resource Strain (CARDIA)    Difficulty of Paying Living Expenses: Not hard at all  Food Insecurity: No Food Insecurity (07/21/2023)   Hunger Vital Sign    Worried About Running Out of Food  in the Last Year: Never true    Ran Out of Food in the Last Year: Never true  Transportation Needs: No Transportation Needs (07/21/2023)   PRAPARE - Administrator, Civil Service (Medical): No    Lack of Transportation (Non-Medical): No  Physical Activity: Insufficiently Active (07/21/2023)   Exercise Vital Sign    Days of Exercise per Week: 3 days    Minutes of Exercise per Session: 30 min  Stress: No Stress Concern Present (07/21/2023)   [facility] [employer] - Occupational Stress Questionnaire    Feeling of Stress : Not at  all  Social Connections: Moderately Isolated (07/21/2023)   Social Connection and Isolation Panel [NHANES]    Frequency of Communication with Friends and Family: More than three times a week    Frequency of Social Gatherings with Friends and Family: Three times a week    Attends Religious Services: 1 to 4 times per year    Active Member of Clubs or Organizations: No    Attends Banker Meetings: Never    Marital Status: Divorced    Tobacco Counseling Counseling given: Not Answered   Clinical Intake:  Pre-visit preparation completed: Yes  Pain : No/denies pain     Diabetes: No  How often do you need to have someone help you when you read instructions, pamphlets, or other written materials from your doctor or pharmacy?: 1 - Never  Interpreter Needed?: No  Information entered by :: Adam Cypress LPN   Activities of Daily Living    07/21/2023    9:37 AM  In your present state of health, do you have any difficulty performing the following activities:  Hearing? 0  Vision? 0  Difficulty concentrating or making decisions? 0  Walking or climbing stairs? 0  Dressing or bathing? 0  Doing errands, shopping? 0  Preparing Food and eating ? N  Using the Toilet? N  In the past six months, have you accidently leaked urine? N  Do you have problems with loss of bowel control? N  Managing your Medications? N  Managing your Finances? N  Housekeeping or managing your Housekeeping? N    Patient Care Team: Adam Hem, FNP as PCP - General (Family Medicine) Adam Reeves, Adam Brisker., MD as Referring Physician (Urology)  Indicate any recent Medical Services you may have received from other than [facility] providers in the past year (date may be approximate).     Assessment:   This is a routine wellness examination for Adam Reeves.  Hearing/Vision screen Hearing Screening - Comments:: Denies hearing difficulties   Vision Screening - Comments:: Wears rx glasses - up to date with  routine eye exams with Adam Reeves     Goals Addressed             This Visit's Progress    Remain active and independent        Depression Screen    07/21/2023    9:58 AM 01/06/2023    7:59 AM 07/15/2022    9:34 AM 07/05/2022    8:06 AM 12/31/2021    9:05 AM 12/29/2020   10:09 AM 06/30/2020   11:54 AM  PHQ 2/9 Scores  PHQ - 2 Score 0 0 0 0 0 0 0  PHQ- 9 Score  0  0  0     Fall Risk    07/21/2023   10:24 AM 01/06/2023    7:59 AM 07/15/2022    9:30 AM 07/05/2022    8:06  AM 07/05/2022    8:03 AM  Fall Risk   Falls in the past year? 0 0 0 0 0  Number falls in past yr: 0 0 0  0  Injury with Fall? 0 0 0  0  Risk for fall due to : No Fall Risks No Fall Risks   No Fall Risks  Follow up Falls prevention discussed;Education provided;Falls evaluation completed Falls evaluation completed Falls prevention discussed;Education provided;Falls evaluation completed  Falls evaluation completed    MEDICARE RISK AT HOME: Medicare Risk at Home Any stairs in or around the home?: No If so, are there any without handrails?: No Home free of loose throw rugs in walkways, pet beds, electrical cords, etc?: Yes Adequate lighting in your home to reduce risk of falls?: Yes Life alert?: No Use of a cane, walker or w/c?: No Grab bars in the bathroom?: Yes Shower chair or bench in shower?: No Elevated toilet seat or a handicapped toilet?: Yes  TIMED UP AND GO:  Was the test performed?  No    Cognitive Function:        07/21/2023   10:25 AM 07/15/2022    9:43 AM  6CIT Screen  What Year? 0 points 0 points  What month? 0 points 0 points  What time? 0 points 0 points  Count back from 20 0 points 0 points  Months in reverse 0 points 0 points  Repeat phrase 0 points 0 points  Total Score 0 points 0 points    Immunizations Immunization History  Administered Date(s) Administered   Influenza-Unspecified 02/14/2000   Tdap 04/28/1995    TDAP status: Due, Education has been provided regarding the  importance of this vaccine. Advised may receive this vaccine at local pharmacy or Health Dept. Aware to provide a copy of the vaccination record if obtained from local pharmacy or Health Dept. Verbalized acceptance and understanding.  Flu Vaccine status: Declined, Education has been provided regarding the importance of this vaccine but patient still declined. Advised may receive this vaccine at local pharmacy or Health Dept. Aware to provide a copy of the vaccination record if obtained from local pharmacy or Health Dept. Verbalized acceptance and understanding.  Pneumococcal vaccine status: Declined,  Education has been provided regarding the importance of this vaccine but patient still declined. Advised may receive this vaccine at local pharmacy or Health Dept. Aware to provide a copy of the vaccination record if obtained from local pharmacy or Health Dept. Verbalized acceptance and understanding.   Covid-19 vaccine status: Declined, Education has been provided regarding the importance of this vaccine but patient still declined. Advised may receive this vaccine at local pharmacy or Health Dept.or vaccine clinic. Aware to provide a copy of the vaccination record if obtained from local pharmacy or Health Dept. Verbalized acceptance and understanding.  Qualifies for Shingles Vaccine? Yes   Zostavax completed No   Shingrix Completed?: No.    Education has been provided regarding the importance of this vaccine. Patient has been advised to call insurance company to determine out of pocket expense if they have not yet received this vaccine. Advised may also receive vaccine at local pharmacy or Health Dept. Verbalized acceptance and understanding.  Screening Tests Health Maintenance  Topic Date Due   Zoster Vaccines- Shingrix (1 of 2) Never done   DTaP/Tdap/Td (2 - Td or Tdap) 04/27/2005   COVID-19 Vaccine (1 - 2024-25 season) Never done   INFLUENZA VACCINE  09/08/2023 (Originally 01/09/2023)   Pneumonia  Vaccine 64+ Years old (  1 of 1 - PCV) 01/06/2024 (Originally 02/05/2020)   Fecal DNA (Cologuard)  10/06/2023   Medicare Annual Wellness (AWV)  07/20/2024   Hepatitis C Screening  Completed   HPV VACCINES  Aged Out    Health Maintenance  Health Maintenance Due  Topic Date Due   Zoster Vaccines- Shingrix (1 of 2) Never done   DTaP/Tdap/Td (2 - Td or Tdap) 04/27/2005   COVID-19 Vaccine (1 - 2024-25 season) Never done    Colorectal cancer screening: Type of screening: Cologuard. Completed 10/05/20. Repeat every 3 years  Lung Cancer Screening: (Low Dose CT Chest recommended if Age 5-80 years, 20 pack-year currently smoking OR have quit w/in 15years.) does not qualify.   Lung Cancer Screening Referral: n/a  Additional Screening:  Hepatitis C Screening: does qualify; Completed 09/01/17  Vision Screening: Recommended annual ophthalmology exams for early detection of glaucoma and other disorders of the eye. Is the patient up to date with their annual eye exam?  Yes  Who is the provider or what is the name of the office in which the patient attends annual eye exams? Adam Reeves  If pt is not established with a provider, would they like to be referred to a provider to establish care? No .   Dental Screening: Recommended annual dental exams for proper oral hygiene  Community Resource Referral / Chronic Care Management: CRR required this visit?  No   CCM required this visit?  No     Plan:     I have personally reviewed and noted the following in the patient's chart:   Medical and social history Use of alcohol, tobacco or illicit drugs  Current medications and supplements including opioid prescriptions. Patient is not currently taking opioid prescriptions. Functional ability and status Nutritional status Physical activity Advanced directives List of other physicians Hospitalizations, surgeries, and ER visits in previous 12 months Vitals Screenings to include cognitive, depression,  and falls Referrals and appointments  In addition, I have reviewed and discussed with patient certain preventive protocols, quality metrics, and best practice recommendations. A written personalized care plan for preventive services as well as general preventive health recommendations were provided to patient.     Adam Reeves [place], [place]   02/06/5620   After Visit Summary: (MyChart) Due to this being a telephonic visit, the after visit summary with patients personalized plan was offered to patient via MyChart   Nurse Notes: No concerns at this time

## 2023-07-21 NOTE — Patient Instructions (Signed)
 Adam Reeves , Thank you for taking time to come for your Medicare Wellness Visit. I appreciate your ongoing commitment to your health goals. Please review the following plan we discussed and let me know if I can assist you in the future.   Referrals/Orders/Follow-Ups/Clinician Recommendations: Aim for 30 minutes of exercise or brisk walking, 6-8 glasses of water, and 5 servings of fruits and vegetables each day.  This is a list of the screening recommended for you and due dates:  Health Maintenance  Topic Date Due   Zoster (Shingles) Vaccine (1 of 2) Never done   DTaP/Tdap/Td vaccine (2 - Td or Tdap) 04/27/2005   COVID-19 Vaccine (1 - 2024-25 season) Never done   Flu Shot  09/08/2023*   Pneumonia Vaccine (1 of 1 - PCV) 01/06/2024*   Cologuard (Stool DNA test)  10/06/2023   Medicare Annual Wellness Visit  07/20/2024   Hepatitis C Screening  Completed   HPV Vaccine  Aged Out  *Topic was postponed. The date shown is not the original due date.    Advanced directives: (ACP Link)Information on Advanced Care Planning can be found at [place]  Secretary of [facility] Advance Health Care Directives Advance Health Care Directives ([email or web address]   Next Medicare Annual Wellness Visit scheduled for next year: Yes

## 2023-07-25 ENCOUNTER — Encounter: Payer: Self-pay | Admitting: Family

## 2023-07-25 ENCOUNTER — Telehealth (INDEPENDENT_AMBULATORY_CARE_PROVIDER_SITE_OTHER): Payer: 59 | Admitting: Family

## 2023-07-25 DIAGNOSIS — I10 Essential (primary) hypertension: Secondary | ICD-10-CM

## 2023-07-25 DIAGNOSIS — E782 Mixed hyperlipidemia: Secondary | ICD-10-CM | POA: Diagnosis not present

## 2023-07-25 DIAGNOSIS — R339 Retention of urine, unspecified: Secondary | ICD-10-CM

## 2023-07-25 DIAGNOSIS — E663 Overweight: Secondary | ICD-10-CM

## 2023-07-25 DIAGNOSIS — N401 Enlarged prostate with lower urinary tract symptoms: Secondary | ICD-10-CM

## 2023-07-25 DIAGNOSIS — N138 Other obstructive and reflux uropathy: Secondary | ICD-10-CM

## 2023-07-25 MED ORDER — HYDROCHLOROTHIAZIDE 25 MG PO TABS: 25.0000 mg | ORAL_TABLET | Freq: Every day | ORAL | 4 refills | Status: AC

## 2023-07-25 MED ORDER — LISINOPRIL 40 MG PO TABS: 40.0000 mg | ORAL_TABLET | Freq: Every day | ORAL | 4 refills | Status: AC

## 2023-07-25 MED ORDER — ATORVASTATIN CALCIUM 40 MG PO TABS: 40.0000 mg | ORAL_TABLET | Freq: Every day | ORAL | 4 refills | Status: AC

## 2023-07-25 NOTE — Patient Instructions (Signed)
Health Maintenance After Age 69 After age 4, you are at a higher risk for certain long-term diseases and infections as well as injuries from falls. Falls are a major cause of broken bones and head injuries in people who are older than age 47. Getting regular preventive care can help to keep you healthy and well. Preventive care includes getting regular testing and making lifestyle changes as recommended by your health care provider. Talk with your health care provider about: Which screenings and tests you should have. A screening is a test that checks for a disease when you have no symptoms. A diet and exercise plan that is right for you. What should I know about screenings and tests to prevent falls? Screening and testing are the best ways to find a health problem early. Early diagnosis and treatment give you the best chance of managing medical conditions that are common after age 37. Certain conditions and lifestyle choices may make you more likely to have a fall. Your health care provider may recommend: Regular vision checks. Poor vision and conditions such as cataracts can make you more likely to have a fall. If you wear glasses, make sure to get your prescription updated if your vision changes. Medicine review. Work with your health care provider to regularly review all of the medicines you are taking, including over-the-counter medicines. Ask your health care provider about any side effects that may make you more likely to have a fall. Tell your health care provider if any medicines that you take make you feel dizzy or sleepy. Strength and balance checks. Your health care provider may recommend certain tests to check your strength and balance while standing, walking, or changing positions. Foot health exam. Foot pain and numbness, as well as not wearing proper footwear, can make you more likely to have a fall. Screenings, including: Osteoporosis screening. Osteoporosis is a condition that causes  the bones to get weaker and break more easily. Blood pressure screening. Blood pressure changes and medicines to control blood pressure can make you feel dizzy. Depression screening. You may be more likely to have a fall if you have a fear of falling, feel depressed, or feel unable to do activities that you used to do. Alcohol use screening. Using too much alcohol can affect your balance and may make you more likely to have a fall. Follow these instructions at home: Lifestyle Do not drink alcohol if: Your health care provider tells you not to drink. If you drink alcohol: Limit how much you have to: 0-1 drink a day for women. 0-2 drinks a day for men. Know how much alcohol is in your drink. In the [place] one drink equals one 12 oz bottle of beer (355 mL), one 5 oz glass of wine (148 mL), or one 1 oz glass of hard liquor (44 mL). Do not use any products that contain nicotine or tobacco. These products include cigarettes, chewing tobacco, and vaping devices, such as e-cigarettes. If you need help quitting, ask your health care provider. Activity  Follow a regular exercise program to stay fit. This will help you maintain your balance. Ask your health care provider what types of exercise are appropriate for you. If you need a cane or walker, use it as recommended by your health care provider. Wear supportive shoes that have nonskid soles. Safety  Remove any tripping hazards, such as rugs, cords, and clutter. Install safety equipment such as grab bars in bathrooms and safety rails on stairs. Keep rooms and walkways  well-lit. General instructions Talk with your health care provider about your risks for falling. Tell your health care provider if: You fall. Be sure to tell your health care provider about all falls, even ones that seem minor. You feel dizzy, tiredness (fatigue), or off-balance. Take over-the-counter and prescription medicines only as told by your health care provider. These include  supplements. Eat a healthy diet and maintain a healthy weight. A healthy diet includes low-fat dairy products, low-fat (lean) meats, and fiber from whole grains, beans, and lots of fruits and vegetables. Stay current with your vaccines. Schedule regular health, dental, and eye exams. Summary Having a healthy lifestyle and getting preventive care can help to protect your health and wellness after age 11. Screening and testing are the best way to find a health problem early and help you avoid having a fall. Early diagnosis and treatment give you the best chance for managing medical conditions that are more common for people who are older than age 28. Falls are a major cause of broken bones and head injuries in people who are older than age 48. Take precautions to prevent a fall at home. Work with your health care provider to learn what changes you can make to improve your health and wellness and to prevent falls. This information is not intended to replace advice given to you by your health care provider. Make sure you discuss any questions you have with your health care provider. Document Revised: 10/16/2020 Document Reviewed: 10/16/2020 Elsevier Patient Education  2024 [employer].

## 2023-07-25 NOTE — Progress Notes (Signed)
Virtual Visit Consent   Adam Reeves, you are scheduled for a virtual visit with a [facility] provider today. Just as with appointments in the office, your consent must be obtained to participate. Your consent will be active for this visit and any virtual visit you may have with one of our providers in the next 365 days. If you have a MyChart account, a copy of this consent can be sent to you electronically.  As this is a virtual visit, video technology does not allow for your provider to perform a traditional examination. This may limit your provider's ability to fully assess your condition. If your provider identifies any concerns that need to be evaluated in person or the need to arrange testing (such as labs, EKG, etc.), we will make arrangements to do so. Although advances in technology are sophisticated, we cannot ensure that it will always work on either your end or our end. If the connection with a video visit is poor, the visit may have to be switched to a telephone visit. With either a video or telephone visit, we are not always able to ensure that we have a secure connection.  By engaging in this virtual visit, you consent to the provision of healthcare and authorize for your insurance to be billed (if applicable) for the services provided during this visit. Depending on your insurance coverage, you may receive a charge related to this service.  I need to obtain your verbal consent now. Are you willing to proceed with your visit today? CORTLAN DOLIN has provided verbal consent on 07/25/2023 for a virtual visit (video or telephone). Jannifer Rodney, FNP  Date: 07/25/2023 8:22 AM   Virtual Visit via Video Note   I, Jannifer Rodney, connected with  [name]  ([id number], 1955-10-20) on 07/25/23 at  8:25 AM EST by a video-enabled telemedicine application and verified that I am speaking with the correct person using two identifiers.  Location: Patient: Virtual Visit Location Patient:  Home Provider: Virtual Visit Location Provider: Home Office   I discussed the limitations of evaluation and management by telemedicine and the availability of in person appointments. The patient expressed understanding and agreed to proceed.    History of Present Illness: Adam Reeves is a 68 y.o. who identifies as a male who was assigned male at birth, and is being seen today for chronic follow up. He is followed by Urologists every 6 months for BPH and elevated PSA.    HPI: Hypertension This is a chronic problem. The current episode started more than 1 year ago. The problem has been resolved since onset. The problem is controlled. Pertinent negatives include no headaches, malaise/fatigue, peripheral edema or shortness of breath. Risk factors for coronary artery disease include dyslipidemia and male gender. The current treatment provides moderate improvement.  Benign Prostatic Hypertrophy This is a chronic problem. The current episode started more than 1 year ago. The problem has been gradually improving since onset. Irritative symptoms include nocturia (0-1). Past treatments include tamsulosin. The treatment provided moderate relief.  Hyperlipidemia This is a chronic problem. The current episode started more than 1 year ago. The problem is controlled. Exacerbating diseases include obesity. Pertinent negatives include no shortness of breath. Current antihyperlipidemic treatment includes statins. The current treatment provides moderate improvement of lipids. Risk factors for coronary artery disease include dyslipidemia, hypertension and a sedentary lifestyle.    Problems:  Patient Active Problem List   Diagnosis Date Noted   Overweight (BMI 25.0-29.9) 12/31/2021  History of UTI 04/09/2021   Elevated PSA 03/30/2021   Urinary retention 03/30/2021   Hyperlipidemia 12/29/2020   Essential (primary) hypertension 08/28/2018   BPH with obstruction/lower urinary tract symptoms 08/28/2018     Allergies: No Known Allergies Medications:  Current Outpatient Medications:    atorvastatin (LIPITOR) 40 MG tablet, Take 1 tablet (40 mg total) by mouth daily., Disp: 90 tablet, Rfl: 4   bethanechol (URECHOLINE) 25 MG tablet, Take 1 tablet (25 mg total) by mouth in the morning and at bedtime., Disp: 60 tablet, Rfl: 11   finasteride (PROSCAR) 5 MG tablet, Take 1 tablet (5 mg total) by mouth daily., Disp: 90 tablet, Rfl: 3   hydrochlorothiazide (HYDRODIURIL) 25 MG tablet, Take 1 tablet (25 mg total) by mouth daily., Disp: 90 tablet, Rfl: 4   lisinopril (ZESTRIL) 40 MG tablet, Take 1 tablet (40 mg total) by mouth daily., Disp: 90 tablet, Rfl: 4   tamsulosin (FLOMAX) 0.4 MG CAPS capsule, Take 1 capsule (0.4 mg total) by mouth daily after supper., Disp: 90 capsule, Rfl: 3  Observations/Objective: Patient is well-developed, well-nourished in no acute distress.  Resting comfortably  at home.  Head is normocephalic, atraumatic.  No labored breathing.  Speech is clear and coherent with logical content.  Patient is alert and oriented at baseline.    Assessment and Plan: 1. Mixed hyperlipidemia - atorvastatin (LIPITOR) 40 MG tablet; Take 1 tablet (40 mg total) by mouth daily.  Dispense: 90 tablet; Refill: 4  2. Essential (primary) hypertension (Primary) - hydrochlorothiazide (HYDRODIURIL) 25 MG tablet; Take 1 tablet (25 mg total) by mouth daily.  Dispense: 90 tablet; Refill: 4 - lisinopril (ZESTRIL) 40 MG tablet; Take 1 tablet (40 mg total) by mouth daily.  Dispense: 90 tablet; Refill: 4  3. BPH with obstruction/lower urinary tract symptoms  4. Overweight (BMI 25.0-29.9)  5. Urinary retention  Continue current medications  Keep follow up with specialists  Healthy diet and exercises encouraged Will hold off on labs until next visit as CPE Follow up in 6 months   Follow Up Instructions: I discussed the assessment and treatment plan with the patient. The patient was provided an  opportunity to ask questions and all were answered. The patient agreed with the plan and demonstrated an understanding of the instructions.  A copy of instructions were sent to the patient via MyChart unless otherwise noted below.     The patient was advised to call back or seek an in-person evaluation if the symptoms worsen or if the condition fails to improve as anticipated.    Jannifer Rodney, FNP

## 2024-01-26 ENCOUNTER — Ambulatory Visit (INDEPENDENT_AMBULATORY_CARE_PROVIDER_SITE_OTHER): Payer: No Typology Code available for payment source | Admitting: Urology

## 2024-01-26 ENCOUNTER — Ambulatory Visit: Payer: Self-pay

## 2024-01-26 VITALS — BP 131/76 | HR 74

## 2024-01-26 DIAGNOSIS — R972 Elevated prostate specific antigen [PSA]: Secondary | ICD-10-CM

## 2024-01-26 DIAGNOSIS — N138 Other obstructive and reflux uropathy: Secondary | ICD-10-CM

## 2024-01-26 DIAGNOSIS — N401 Enlarged prostate with lower urinary tract symptoms: Secondary | ICD-10-CM

## 2024-01-26 DIAGNOSIS — R3129 Other microscopic hematuria: Secondary | ICD-10-CM

## 2024-01-26 DIAGNOSIS — N400 Enlarged prostate without lower urinary tract symptoms: Secondary | ICD-10-CM

## 2024-01-26 DIAGNOSIS — R3912 Poor urinary stream: Secondary | ICD-10-CM

## 2024-01-26 LAB — MICROSCOPIC EXAMINATION: WBC, UA: >30 /HPF — AB (ref 0–5)

## 2024-01-26 LAB — URINALYSIS, ROUTINE W REFLEX MICROSCOPIC
Bilirubin, UA: NEGATIVE
Glucose, UA: NEGATIVE
Ketones, UA: NEGATIVE
Nitrite, UA: POSITIVE — AB
Specific Gravity, UA: 1.015 (ref 1.005–1.030)
Urobilinogen, Ur: 0.2 mg/dL (ref 0.2–1.0)
pH, UA: 7.5 (ref 5.0–7.5)

## 2024-01-26 MED ORDER — FINASTERIDE 5 MG PO TABS: 5.0000 mg | ORAL_TABLET | Freq: Every day | ORAL | 3 refills | Status: AC

## 2024-01-26 MED ORDER — TAMSULOSIN HCL 0.4 MG PO CAPS: 0.4000 mg | ORAL_CAPSULE | Freq: Every day | ORAL | 3 refills | Status: AC

## 2024-01-26 NOTE — Progress Notes (Unsigned)
 01/26/2024 10:21 AM   Adam Reeves 1955-12-15 [id number]  Referring provider: Lavell Bari LABOR, FNP [address] [place],  [place] [address]  No chief complaint on file.   HPI:  F/u -    1) BPH - presented with urinary retention 10/22. His prostate was 230 g on MRI 05/23. He was on silodosin  but ran out and on finasteride . He tried CIC and failed. CT benign - BPH. He passed a void trial 10/22.    F/u IPSS 0-5. PVR = 1 ml.   2) PSA elevation - his PSA in 03/19 was 8.9 with 12% free. PSA from 12/22 was 6.0 (12 corrected for finasteride ) with 14.5% free.  He has not wanted to proceed with a prostate biopsy.   03/23 ExoDx test was 48.61 indicating an increased risk of high-grade prostate cancer. He underwent further evaluation with a MRI of the prostate on 05/23 which showed a prostate volume of 230 mL, no areas suspicious for high-grade prostate cancer, diffuse bladder wall thickening consistent with chronic bladder outlet obstruction. His Jul 2024 PSA was 6 (12).    Today, seen for the above. He continues on tamsulosin  (changed from silodosin ), finasteride , and Urecholine . His urinary symptoms remain stable.  He has not required urinary catheterization.  No dysuria or gross hematuria.  IPSS = 0.   UA with 3-10 rbc and many bacteria.    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   PMH: Past Medical History:  Diagnosis Date   Hypertension     Surgical History: Past Surgical History:  Procedure Laterality Date   BACK SURGERY     HERNIA REPAIR      Home Medications:  Allergies as of 01/26/2024   No Known Allergies      Medication List        Accurate as of January 26, 2024 10:21 AM. If you have any questions, ask your nurse or doctor.          atorvastatin  40 MG tablet Commonly known as: LIPITOR Take 1 tablet (40 mg total) by mouth daily.   bethanechol  25 MG tablet Commonly known as: URECHOLINE  Take 1 tablet (25 mg total) by mouth in the morning and at  bedtime.   finasteride  5 MG tablet Commonly known as: PROSCAR  Take 1 tablet (5 mg total) by mouth daily.   hydrochlorothiazide  25 MG tablet Commonly known as: HYDRODIURIL  Take 1 tablet (25 mg total) by mouth daily.   lisinopril  40 MG tablet Commonly known as: ZESTRIL  Take 1 tablet (40 mg total) by mouth daily.   tamsulosin  0.4 MG Caps capsule Commonly known as: FLOMAX  Take 1 capsule (0.4 mg total) by mouth daily after supper.        Allergies: No Known Allergies  Family History: No family history on file.  Social History:  reports that he has never smoked. He has never used smokeless tobacco. He reports current alcohol use. He reports that he does not use drugs.   Physical Exam: BP 131/76   Pulse 74   Constitutional:  Alert and oriented, No acute distress. HEENT: [place] AT, moist mucus membranes.  Trachea midline, no masses. Cardiovascular: No clubbing, cyanosis, or edema. Respiratory: Normal respiratory effort, no increased work of breathing. GI: Abdomen is soft, nontender, nondistended, no abdominal masses GU: No CVA tenderness Skin: No rashes, bruises or suspicious lesions. Neurologic: Grossly intact, no focal deficits, moving all 4 extremities. Psychiatric: Normal mood and affect.  Laboratory Data: Lab Results  Component Value Date   WBC 5.2 01/06/2023   HGB 14.2 01/06/2023   HCT 41.7 01/06/2023   MCV 87 01/06/2023   PLT 200 01/06/2023    Lab Results  Component Value Date   CREATININE 1.01 01/06/2023    No results found for: PSA  No results found for: TESTOSTERONE   No results found for: HGBA1C  Urinalysis    Component Value Date/Time   COLORURINE RED (A) 08/27/2014 0051   APPEARANCEUR Clear 01/27/2023 1432   LABSPEC 1.015 08/27/2014 0051   PHURINE 5.0 08/27/2014 0051   GLUCOSEU Negative 01/27/2023 1432   HGBUR LARGE (A) 08/27/2014 0051   BILIRUBINUR Negative 01/27/2023 1432   KETONESUR TRACE (A) 08/27/2014 0051   PROTEINUR Negative  01/27/2023 1432   PROTEINUR >300 (A) 08/27/2014 0051   UROBILINOGEN 2.0 (H) 08/27/2014 0051   NITRITE Negative 01/27/2023 1432   NITRITE POSITIVE (A) 08/27/2014 0051   LEUKOCYTESUR Negative 01/27/2023 1432    Lab Results  Component Value Date   LABMICR Comment 01/27/2023   WBCUA 0-5 06/24/2022   LABEPIT 0-10 06/24/2022   BACTERIA None seen 06/24/2022    Pertinent Imaging:   Results for orders placed during the hospital encounter of 08/27/14  CT Renal Stone Study  Narrative CLINICAL DATA:  Hematuria  EXAM: CT ABDOMEN AND PELVIS WITHOUT CONTRAST  TECHNIQUE: Multidetector CT imaging of the abdomen and pelvis was performed following the standard protocol without IV contrast.  COMPARISON:  08/26/2014 radiograph  FINDINGS: Normal heart size. Trace pericardial fluid. Mild bibasilar dependent atelectasis.  Organ evaluation limited without intravenous contrast. Within this limitation, no appreciable abnormality of the liver, biliary system, pancreas. Curvilinear calcification along the periphery of the spleen suggest sequelae of prior trauma or hematoma. No adrenal nodule.  Mild right greater than left perinephric fat stranding. No urinary tract calculi. No hydroureteronephrosis.  No overt colitis. Colonic diverticulosis. No CT evidence for acute diverticulitis. Normal appendix. Small bowel loops are of normal course and caliber. No free intraperitoneal air or fluid. No lymphadenopathy.  Scattered atherosclerotic disease of the aorta and branch vessels without aneurysmal dilatation.  Circumferential bladder wall thickening. Perivesicular fat stranding and ill-defined fluid. Enlarged prostate gland measuring 7 cm transverse diameter. A Foley catheter is in place within the decompressed bladder.  Multilevel degenerative changes. Grade 1 retrolisthesis of L4 on L5. Sclerotic focus proximal right femoral shaft, favored to reflect a bone island. Right posterior  lateral lower chest musculature lipoma.  IMPRESSION: Circumferential bladder wall thickening may reflect chronic partial outlet obstruction in the setting of prostatomegaly. Perivesicular fat stranding raises concern for superimposed infection or inflammation. Mild bilateral perinephric fat stranding may reflect ascending infection and/or medical renal disease. Correlate with urinalysis and creatinine.  No urinary tract calculi or hydroureteronephrosis.  Given these findings and the stated history, recommend urology follow-up and cystoscopy.   Electronically Signed By: Andrew  DelGaizo M.D. On: 08/27/2014 02:08   Assessment & Plan:    1. BPH with obstruction/lower urinary tract symptoms (Primary) Check PSA , refill silod and finast. - Urinalysis, Routine w reflex microscopic  2. MH - send urine for cx and recheck urine in about 6 weeks.   No follow-ups on file.  Donnice Brooks, MD  [facility]  [address] [place], [place] [address] [phone]

## 2024-01-27 LAB — PSA: Prostate Specific Ag, Serum: 7 ng/mL — ABNORMAL HIGH (ref 0.0–4.0)

## 2024-01-30 LAB — URINE CULTURE

## 2024-02-03 ENCOUNTER — Other Ambulatory Visit: Payer: Self-pay

## 2024-02-03 DIAGNOSIS — R972 Elevated prostate specific antigen [PSA]: Secondary | ICD-10-CM

## 2024-02-03 MED ORDER — CEPHALEXIN 500 MG PO CAPS: 500.0000 mg | ORAL_CAPSULE | Freq: Two times a day (BID) | ORAL | 0 refills | Status: AC

## 2024-02-03 NOTE — Addendum Note (Signed)
 Addended by: ANN VELERIA SAUNDERS on: 02/03/2024 08:04 AM   Modules accepted: Orders

## 2024-03-29 ENCOUNTER — Other Ambulatory Visit

## 2024-03-29 DIAGNOSIS — R972 Elevated prostate specific antigen [PSA]: Secondary | ICD-10-CM

## 2024-03-29 DIAGNOSIS — R3129 Other microscopic hematuria: Secondary | ICD-10-CM

## 2024-03-29 LAB — URINALYSIS, ROUTINE W REFLEX MICROSCOPIC
Bilirubin, UA: NEGATIVE
Glucose, UA: NEGATIVE
Ketones, UA: NEGATIVE
Leukocytes,UA: NEGATIVE
Nitrite, UA: NEGATIVE
Protein,UA: NEGATIVE
RBC, UA: NEGATIVE
Specific Gravity, UA: 1.02 (ref 1.005–1.030)
Urobilinogen, Ur: 0.2 mg/dL (ref 0.2–1.0)
pH, UA: 6 (ref 5.0–7.5)

## 2024-07-21 ENCOUNTER — Ambulatory Visit: Payer: PRIVATE HEALTH INSURANCE

## 2024-07-23 ENCOUNTER — Ambulatory Visit

## 2024-09-20 ENCOUNTER — Ambulatory Visit

## 2025-01-24 ENCOUNTER — Ambulatory Visit: Admitting: Urology
